# Patient Record
Sex: Female | Born: 2000 | Race: Black or African American | Hispanic: No | Marital: Single | Smoking: Never smoker
Health system: Southern US, Community
[De-identification: ages and names within clinical notes are randomized; demographics above are authoritative.]

## PROBLEM LIST (undated history)

## (undated) DIAGNOSIS — I471 Supraventricular tachycardia, unspecified: Secondary | ICD-10-CM

## (undated) DIAGNOSIS — R768 Other specified abnormal immunological findings in serum: Secondary | ICD-10-CM

## (undated) DIAGNOSIS — E559 Vitamin D deficiency, unspecified: Secondary | ICD-10-CM

## (undated) HISTORY — DX: Other specified abnormal immunological findings in serum: R76.8

## (undated) HISTORY — DX: Supraventricular tachycardia: I47.1

## (undated) HISTORY — PX: NO PAST SURGERIES: SHX2092

## (undated) HISTORY — DX: Supraventricular tachycardia, unspecified: I47.10

## (undated) HISTORY — DX: Vitamin D deficiency, unspecified: E55.9

---
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
## 2019-08-13 ENCOUNTER — Ambulatory Visit (HOSPITAL_COMMUNITY)
Admission: EM | Admit: 2019-08-13 | Discharge: 2019-08-13 | Disposition: A | Discharge status: Home / Self Care | Payer: BLUE CROSS/BLUE SHIELD | Attending: Family Medicine | Admitting: Family Medicine

## 2019-08-13 ENCOUNTER — Other Ambulatory Visit: Payer: Self-pay

## 2019-08-13 ENCOUNTER — Encounter (HOSPITAL_COMMUNITY): Payer: Self-pay | Admitting: *Deleted

## 2019-08-13 DIAGNOSIS — N390 Urinary tract infection, site not specified: Secondary | ICD-10-CM

## 2019-08-13 DIAGNOSIS — Z3202 Encounter for pregnancy test, result negative: Secondary | ICD-10-CM

## 2019-08-13 LAB — POCT URINALYSIS DIP (DEVICE)
Bilirubin Urine: NEGATIVE
Glucose, UA: NEGATIVE mg/dL
Ketones, ur: NEGATIVE mg/dL
Nitrite: NEGATIVE
Protein, ur: >=300 mg/dL — AB
Specific Gravity, Urine: >=1.03 (ref 1.005–1.030)
Urobilinogen, UA: 0.2 mg/dL (ref 0.0–1.0)
pH: 6.5 (ref 5.0–8.0)

## 2019-08-13 LAB — POCT PREGNANCY, URINE: Preg Test, Ur: NEGATIVE

## 2019-08-13 MED ORDER — NITROFURANTOIN MONOHYD MACRO 100 MG PO CAPS: 100.0000 mg | ORAL_CAPSULE | Freq: Two times a day (BID) | ORAL | 0 refills | Status: AC

## 2019-08-13 NOTE — ED Provider Notes (Signed)
[facility]    CSN: [id number] Arrival date & time: 08/13/19  1113      History   Chief Complaint Chief Complaint  Patient presents with  . Dysuria    HPI Nancy Page is a 18 y.o. female.   Nancy Page presents with complaints of pain and frequency of urination which started yesterday. No pelvic pain, no back pain. No fevers. No nausea or vomiting. Has had UTI in the past and this feels similar. No vaginal symptoms. LMP in July, she is on oral contraceptives with period every 3 months at baseline for her, however. Without contributing medical history.      ROS per HPI, negative if not otherwise mentioned.      History reviewed. No pertinent past medical history.  There are no active problems to display for this patient.   History reviewed. No pertinent surgical history.  OB History   No obstetric history on file.      Home Medications    Prior to Admission medications   Medication Sig Start Date End Date Taking? Authorizing Provider  levonorgestrel-ethinyl estradiol (SEASONALE,INTROVALE,JOLESSA) 0.15-0.03 MG tablet Take 1 tablet by mouth daily.   Yes [provider]  azithromycin (ZITHROMAX) 250 MG tablet Take 1 tablet (250 mg total) by mouth daily. Take first 2 tablets together, then 1 every day until finished. 10/03/17   Tasia Catchings, Amy V, PA-C  fluticasone (FLONASE) 50 MCG/ACT nasal spray Place 2 sprays into both nostrils daily. 10/03/17   Tasia Catchings, Amy V, PA-C  nitrofurantoin, macrocrystal-monohydrate, (MACROBID) 100 MG capsule Take 1 capsule (100 mg total) by mouth 2 (two) times daily for 5 days. 08/13/19 08/18/19  Zigmund Gottron, NP    Family History Family History  Problem Relation Age of Onset  . Healthy Mother   . Healthy Father     Social History Social History   Tobacco Use  . Smoking status: Never Smoker  . Smokeless tobacco: Never Used  Substance Use Topics  . Alcohol use: Yes    Comment: occasionally  . Drug use: Never     Allergies   Cephalexin, Milk-related compounds, and Wheat bran   Review of Systems Review of Systems   Physical Exam Triage Vital Signs ED Triage Vitals  Enc Vitals Group     BP 08/13/19 1128 112/70     Pulse Rate 08/13/19 1127 77     Resp 08/13/19 1127 16     Temp 08/13/19 1127 97.8 F (36.6 C)     Temp Source 08/13/19 1127 Other     SpO2 08/13/19 1127 100 %     Weight --      Height --      Head Circumference --      Peak Flow --      Pain Score 08/13/19 1128 0     Pain Loc --      Pain Edu? --      Excl. in [place]? --    No data found.  Updated Vital Signs BP 112/70   Pulse 77   Temp 97.8 F (36.6 C) (Other (Comment))   Resp 16   SpO2 100%    Physical Exam Constitutional:      General: She is not in acute distress.    Appearance: She is well-developed.  Cardiovascular:     Rate and Rhythm: Normal rate and regular rhythm.  Pulmonary:     Effort: Pulmonary effort is normal.  Abdominal:     General: There is  no distension.     Tenderness: There is no abdominal tenderness. There is no right CVA tenderness or left CVA tenderness.  Skin:    General: Skin is warm and dry.  Neurological:     Mental Status: She is alert and oriented to person, place, and time.      UC Treatments / Results  Labs (all labs ordered are listed, but only abnormal results are displayed) Labs Reviewed  POCT URINALYSIS DIP (DEVICE) - Abnormal; Notable for the following components:      Result Value   Hgb urine dipstick LARGE (*)    Protein, ur >=300 (*)    Leukocytes,Ua SMALL (*)    All other components within normal limits  URINE CULTURE  POC URINE PREG, ED  POCT PREGNANCY, URINE    EKG   Radiology No results found.  Procedures Procedures (including critical care time)  Medications Ordered in UC Medications - No data to display  Initial Impression / Assessment and Plan / UC Course  I have reviewed the triage vital signs and the nursing notes.  Pertinent labs &  imaging results that were available during my care of the patient were reviewed by me and considered in my medical decision making (see chart for details).     hgb and leuks to urine with symptoms consistent with uti. macrobid initiated. Culture pending. Return precautions provided. Patient verbalized understanding and agreeable to plan.    Final Clinical Impressions(s) / UC Diagnoses   Final diagnoses:  Lower urinary tract infectious disease     Discharge Instructions     Your urine and symptoms are consistent with UTI. Complete course of antibiotics.  Drink plenty of water to empty bladder regularly. Avoid alcohol and caffeine as these may irritate the bladder.  If symptoms worsen or do not improve in the next week to return to be seen or to follow up with your PCP.       ED Prescriptions    Medication Sig Dispense Auth. Provider   nitrofurantoin, macrocrystal-monohydrate, (MACROBID) 100 MG capsule Take 1 capsule (100 mg total) by mouth 2 (two) times daily for 5 days. 10 capsule Georgetta HaberBurky, Natalie B, NP     Controlled Substance Prescriptions [place] Controlled Substance Registry consulted? Not Applicable   Georgetta HaberBurky, Natalie B, NP 08/13/19 1209

## 2019-08-13 NOTE — Discharge Instructions (Signed)
Your urine and symptoms are consistent with UTI. Complete course of antibiotics.  Drink plenty of water to empty bladder regularly. Avoid alcohol and caffeine as these may irritate the bladder.  If symptoms worsen or do not improve in the next week to return to be seen or to follow up with your PCP.

## 2019-08-13 NOTE — ED Triage Notes (Signed)
C/O dysuria, polyuria, urinary urgency since yesterday.  Denies abd pain, fevers.  Denies vaginal discharge.

## 2019-08-15 LAB — URINE CULTURE: Culture: >=100000 — AB

## 2019-08-17 ENCOUNTER — Telehealth (HOSPITAL_COMMUNITY): Payer: Self-pay | Admitting: Emergency Medicine

## 2019-08-17 NOTE — Telephone Encounter (Signed)
Urine culture was positive for e coli and was given  macrobid at urgent care visit. Attempted to reach patient. No answer at this time.   

## 2020-03-18 DIAGNOSIS — R05 Cough: Secondary | ICD-10-CM | POA: Diagnosis not present

## 2020-03-18 DIAGNOSIS — Z20822 Contact with and (suspected) exposure to covid-19: Secondary | ICD-10-CM | POA: Diagnosis not present

## 2020-03-18 DIAGNOSIS — R110 Nausea: Secondary | ICD-10-CM | POA: Diagnosis not present

## 2020-03-18 DIAGNOSIS — R062 Wheezing: Secondary | ICD-10-CM | POA: Diagnosis not present

## 2020-04-15 DIAGNOSIS — B9689 Other specified bacterial agents as the cause of diseases classified elsewhere: Secondary | ICD-10-CM | POA: Diagnosis not present

## 2020-04-15 DIAGNOSIS — N760 Acute vaginitis: Secondary | ICD-10-CM | POA: Diagnosis not present

## 2020-07-05 DIAGNOSIS — Z23 Encounter for immunization: Secondary | ICD-10-CM | POA: Diagnosis not present

## 2020-07-05 DIAGNOSIS — S61217A Laceration without foreign body of left little finger without damage to nail, initial encounter: Secondary | ICD-10-CM | POA: Diagnosis not present

## 2020-07-05 DIAGNOSIS — W268XXA Contact with other sharp object(s), not elsewhere classified, initial encounter: Secondary | ICD-10-CM | POA: Diagnosis not present

## 2020-10-03 DIAGNOSIS — N760 Acute vaginitis: Secondary | ICD-10-CM | POA: Diagnosis not present

## 2020-10-03 DIAGNOSIS — B9689 Other specified bacterial agents as the cause of diseases classified elsewhere: Secondary | ICD-10-CM | POA: Diagnosis not present

## 2021-03-18 ENCOUNTER — Ambulatory Visit (HOSPITAL_COMMUNITY)
Admission: EM | Admit: 2021-03-18 | Discharge: 2021-03-18 | Disposition: A | Discharge status: Home / Self Care | Payer: BC Managed Care – PPO | Attending: Student | Admitting: Student

## 2021-03-18 ENCOUNTER — Other Ambulatory Visit: Payer: Self-pay

## 2021-03-18 ENCOUNTER — Encounter (HOSPITAL_COMMUNITY): Payer: Self-pay | Admitting: Emergency Medicine

## 2021-03-18 DIAGNOSIS — Z1152 Encounter for screening for COVID-19: Secondary | ICD-10-CM | POA: Insufficient documentation

## 2021-03-18 DIAGNOSIS — J029 Acute pharyngitis, unspecified: Secondary | ICD-10-CM | POA: Insufficient documentation

## 2021-03-18 DIAGNOSIS — Z112 Encounter for screening for other bacterial diseases: Secondary | ICD-10-CM | POA: Diagnosis not present

## 2021-03-18 DIAGNOSIS — R059 Cough, unspecified: Secondary | ICD-10-CM | POA: Diagnosis not present

## 2021-03-18 DIAGNOSIS — J069 Acute upper respiratory infection, unspecified: Secondary | ICD-10-CM | POA: Insufficient documentation

## 2021-03-18 LAB — POCT RAPID STREP A, ED / UC
Streptococcus, Group A Screen (Direct): NEGATIVE
Streptococcus, Group A Screen (Direct): NEGATIVE

## 2021-03-18 LAB — SARS CORONAVIRUS 2 (TAT 6-24 HRS): SARS Coronavirus 2: NEGATIVE

## 2021-03-18 MED ORDER — PREDNISONE 20 MG PO TABS: 20.0000 mg | ORAL_TABLET | Freq: Every day | ORAL | 0 refills | Status: AC

## 2021-03-18 MED ORDER — LIDOCAINE VISCOUS HCL 2 % MT SOLN: 15.0000 mL | OROMUCOSAL | 0 refills | Status: DC | PRN

## 2021-03-18 NOTE — ED Triage Notes (Signed)
Onset yesterday morning of sore throat, cough, and runny nose, headache and body aches.

## 2021-03-18 NOTE — Discharge Instructions (Addendum)
-  Prednisone, 1 pill taken in the morning for 5 days in a row.  This can give you energy, so take earlier in the day. -For sore throat, use lidocaine mouthwash up to every 4 hours. Make sure not to eat for at least 1 hour after using this, as your mouth will be very numb and you could bite yourself.  -Take Tylenol 1000 mg 3 times daily, and ibuprofen 800 mg 3 times daily with food.  You can take these together, or alternate every 3-4 hours. -Come back and see [place] if your symptoms worsen or persist, like worsening of sore throat, trouble swallowing, shortness of breath, etc.

## 2021-03-18 NOTE — ED Provider Notes (Signed)
[facility]    CSN: [id number] Arrival date & time: 03/18/21  2025      History   Chief Complaint Chief Complaint  Patient presents with  . Cough    HPI Nancy Page is a 20 y.o. female presenting with viral URI symptoms x1 day. Medical history noncontributory. Notes sore throat, nasal congestion, occ dry cough, body aches, mild headaches. Denies fevers/chills, n/v/d, shortness of breath, chest pain, facial pain, teeth pain, loss of taste/smell, swollen lymph nodes, ear pain.    HPI  History reviewed. No pertinent past medical history.  There are no problems to display for this patient.   History reviewed. No pertinent surgical history.  OB History   No obstetric history on file.      Home Medications    Prior to Admission medications   Medication Sig Start Date End Date Taking? Authorizing Provider  lidocaine (XYLOCAINE) 2 % solution Use as directed 15 mLs in the mouth or throat as needed for mouth pain. 03/18/21  Yes Rhys Martini, PA-C  predniSONE (DELTASONE) 20 MG tablet Take 1 tablet (20 mg total) by mouth daily for 5 days. 03/18/21 03/23/21 Yes Rhys Martini, PA-C  fluticasone (FLONASE) 50 MCG/ACT nasal spray Place 2 sprays into both nostrils daily. 10/03/17 03/18/21  Belinda Fisher, PA-C  levonorgestrel-ethinyl estradiol (SEASONALE,INTROVALE,JOLESSA) 0.15-0.03 MG tablet Take 1 tablet by mouth daily.  03/18/21  [provider]    Family History Family History  Problem Relation Age of Onset  . Healthy Mother   . Healthy Father     Social History Social History   Tobacco Use  . Smoking status: Never Smoker  . Smokeless tobacco: Never Used  Vaping Use  . Vaping Use: Never used  Substance Use Topics  . Alcohol use: Yes    Comment: occasionally  . Drug use: Never     Allergies   Cephalexin, Milk-related compounds, and Wheat bran   Review of Systems Review of Systems  Constitutional: Negative for appetite change, chills and  fever.  HENT: Positive for congestion and sore throat. Negative for ear pain, rhinorrhea, sinus pressure and sinus pain.   Eyes: Negative for redness and visual disturbance.  Respiratory: Positive for cough. Negative for chest tightness, shortness of breath and wheezing.   Cardiovascular: Negative for chest pain and palpitations.  Gastrointestinal: Negative for abdominal pain, constipation, diarrhea, nausea and vomiting.  Genitourinary: Negative for dysuria, frequency and urgency.  Musculoskeletal: Positive for myalgias.  Neurological: Positive for headaches. Negative for dizziness and weakness.  Psychiatric/Behavioral: Negative for confusion.  All other systems reviewed and are negative.    Physical Exam Triage Vital Signs ED Triage Vitals  Enc Vitals Group     BP      Pulse      Resp      Temp      Temp src      SpO2      Weight      Height      Head Circumference      Peak Flow      Pain Score      Pain Loc      Pain Edu?      Excl. in [place]?    No data found.  Updated Vital Signs BP 119/80 (BP Location: Left Arm) Comment: repositioned  Pulse 88   Temp 99.1 F (37.3 C) (Oral)   Resp 16   LMP 03/13/2021   SpO2 100%   Visual Acuity Right  Eye Distance:   Left Eye Distance:   Bilateral Distance:    Right Eye Near:   Left Eye Near:    Bilateral Near:     Physical Exam Vitals reviewed.  Constitutional:      General: She is not in acute distress.    Appearance: Normal appearance. She is not ill-appearing.  HENT:     Head: Normocephalic and atraumatic.     Right Ear: Hearing, tympanic membrane, ear canal and external ear normal. No swelling or tenderness. There is no impacted cerumen. No mastoid tenderness. Tympanic membrane is not perforated, erythematous, retracted or bulging.     Left Ear: Hearing, tympanic membrane, ear canal and external ear normal. No swelling or tenderness. There is no impacted cerumen. No mastoid tenderness. Tympanic membrane is not  perforated, erythematous, retracted or bulging.     Nose:     Right Sinus: No maxillary sinus tenderness or frontal sinus tenderness.     Left Sinus: No maxillary sinus tenderness or frontal sinus tenderness.     Mouth/Throat:     Mouth: Mucous membranes are moist.     Pharynx: Uvula midline. No oropharyngeal exudate or posterior oropharyngeal erythema.     Tonsils: No tonsillar exudate. 1+ on the right. 1+ on the left.     Comments: Smooth erythema posterior pharynx Cardiovascular:     Rate and Rhythm: Normal rate and regular rhythm.     Heart sounds: Normal heart sounds.  Pulmonary:     Breath sounds: Normal breath sounds and air entry. No wheezing, rhonchi or rales.  Chest:     Chest wall: No tenderness.  Abdominal:     General: Abdomen is flat. Bowel sounds are normal.     Tenderness: There is no abdominal tenderness. There is no guarding or rebound.  Lymphadenopathy:     Cervical: No cervical adenopathy.  Skin:    Capillary Refill: Capillary refill takes less than 2 seconds.  Neurological:     General: No focal deficit present.     Mental Status: She is alert and oriented to person, place, and time.  Psychiatric:        Attention and Perception: Attention and perception normal.        Mood and Affect: Mood and affect normal.        Behavior: Behavior normal. Behavior is cooperative.        Thought Content: Thought content normal.        Judgment: Judgment normal.      UC Treatments / Results  Labs (all labs ordered are listed, but only abnormal results are displayed) Labs Reviewed  CULTURE, GROUP A STREP ([facility])  SARS CORONAVIRUS 2 (TAT 6-24 HRS)  POCT RAPID STREP A, ED / UC  POCT RAPID STREP A, ED / UC    EKG   Radiology No results found.  Procedures Procedures (including critical care time)  Medications Ordered in UC Medications - No data to display  Initial Impression / Assessment and Plan / UC Course  I have reviewed the triage vital signs and the  nursing notes.  Pertinent labs & imaging results that were available during my care of the patient were reviewed by me and considered in my medical decision making (see chart for details).     This patient is a 20 year old female presenting with viral URI. Today this pt is afebrile nontachycardic nontachypneic, oxygenating well on room air, no wheezes rhonchi or rales.   Rapid strep negative. Culture sent. Covid PCR sent.  Isolation as per CDC guidelines. Prednisone and viscous lidocaine as below. She is not a diabetic. ED return precautions discussed  Final Clinical Impressions(s) / UC Diagnoses   Final diagnoses:  Viral URI with cough  Screening for streptococcal infection  Encounter for screening for COVID-19  Acute pharyngitis, unspecified etiology     Discharge Instructions     -Prednisone, 1 pill taken in the morning for 5 days in a row.  This can give you energy, so take earlier in the day. -For sore throat, use lidocaine mouthwash up to every 4 hours. Make sure not to eat for at least 1 hour after using this, as your mouth will be very numb and you could bite yourself.  -Take Tylenol 1000 mg 3 times daily, and ibuprofen 800 mg 3 times daily with food.  You can take these together, or alternate every 3-4 hours. -Come back and see [place] if your symptoms worsen or persist, like worsening of sore throat, trouble swallowing, shortness of breath, etc.    ED Prescriptions    Medication Sig Dispense Auth. Provider   predniSONE (DELTASONE) 20 MG tablet Take 1 tablet (20 mg total) by mouth daily for 5 days. 5 tablet Ignacia Bayley E, PA-C   lidocaine (XYLOCAINE) 2 % solution Use as directed 15 mLs in the mouth or throat as needed for mouth pain. 100 mL Rhys Martini, PA-C     PDMP not reviewed this encounter.   Rhys Martini, PA-C 03/18/21 [phone]

## 2021-03-20 LAB — CULTURE, GROUP A STREP (THRC)

## 2021-04-09 DIAGNOSIS — J209 Acute bronchitis, unspecified: Secondary | ICD-10-CM | POA: Diagnosis not present

## 2021-06-24 ENCOUNTER — Ambulatory Visit
Admission: EM | Admit: 2021-06-24 | Discharge: 2021-06-24 | Disposition: A | Discharge status: Home / Self Care | Payer: BC Managed Care – PPO | Attending: Emergency Medicine | Admitting: Emergency Medicine

## 2021-06-24 DIAGNOSIS — N760 Acute vaginitis: Secondary | ICD-10-CM | POA: Diagnosis not present

## 2021-06-24 LAB — POCT URINALYSIS DIP (MANUAL ENTRY)
Bilirubin, UA: NEGATIVE
Glucose, UA: NEGATIVE mg/dL
Nitrite, UA: NEGATIVE
Protein Ur, POC: NEGATIVE mg/dL
Spec Grav, UA: 1.015 (ref 1.010–1.025)
Urobilinogen, UA: 1 E.U./dL
pH, UA: 6.5 (ref 5.0–8.0)

## 2021-06-24 LAB — POCT URINE PREGNANCY: Preg Test, Ur: NEGATIVE

## 2021-06-24 MED ORDER — METRONIDAZOLE 500 MG PO TABS: 500.0000 mg | ORAL_TABLET | Freq: Two times a day (BID) | ORAL | 0 refills | Status: DC

## 2021-06-24 NOTE — Discharge Instructions (Addendum)
Urine culture to further rule out UTI, continue to drink plenty of fluids Vaginal swab pending-we will call if abnormal Begin metronidazole/Flagyl twice daily x1 week, no alcohol while taking May use over-the-counter hydrocortisone cream topically to area to help with the irritation over the next few days Follow-up if not improving or worsening

## 2021-06-24 NOTE — ED Triage Notes (Signed)
Pt presents with concern for vaginal itching and irritation after using a new soap. Denies discharge or vaginal odor. Denies concern for STD's.

## 2021-06-25 DIAGNOSIS — N760 Acute vaginitis: Secondary | ICD-10-CM | POA: Diagnosis not present

## 2021-06-25 NOTE — ED Provider Notes (Signed)
[facility]    CSN: [id number] Arrival date & time: 06/24/21  1425      History   Chief Complaint Chief Complaint  Patient presents with   Vaginal Itching    HPI Nancy Page is a 20 y.o. female presenting today for evaluation of vaginal irritation.  Patient reports over the past couple days she has developed vaginal irritation, denies associated discharge or itching.  Denies dysuria, but does report urinary frequency.  Reports symptoms began after changing soaps.  In the past with using different soaps she has had bacterial vaginosis.  She denies specific concerns for STDs.  Recently ended menstrual cycle last week.  HPI  History reviewed. No pertinent past medical history.  There are no problems to display for this patient.   History reviewed. No pertinent surgical history.  OB History   No obstetric history on file.      Home Medications    Prior to Admission medications   Medication Sig Start Date End Date Taking? Authorizing Provider  lidocaine (XYLOCAINE) 2 % solution Use as directed 15 mLs in the mouth or throat as needed for mouth pain. 03/18/21   Rhys Martini, PA-C  metroNIDAZOLE (FLAGYL) 500 MG tablet Take 1 tablet (500 mg total) by mouth 2 (two) times daily for 7 days. 06/24/21 07/01/21  Sadiyah Kangas C, PA-C  fluticasone (FLONASE) 50 MCG/ACT nasal spray Place 2 sprays into both nostrils daily. 10/03/17 03/18/21  Belinda Fisher, PA-C  levonorgestrel-ethinyl estradiol (SEASONALE,INTROVALE,JOLESSA) 0.15-0.03 MG tablet Take 1 tablet by mouth daily.  03/18/21  [provider]    Family History Family History  Problem Relation Age of Onset   Healthy Mother    Healthy Father     Social History Social History   Tobacco Use   Smoking status: Never   Smokeless tobacco: Never  Vaping Use   Vaping Use: Never used  Substance Use Topics   Alcohol use: Yes    Comment: occasionally   Drug use: Never     Allergies   Cephalexin,  Milk-related compounds, and Wheat bran   Review of Systems Review of Systems  Constitutional:  Negative for fever.  Respiratory:  Negative for shortness of breath.   Cardiovascular:  Negative for chest pain.  Gastrointestinal:  Negative for abdominal pain, diarrhea, nausea and vomiting.  Genitourinary:  Negative for dysuria, flank pain, genital sores, hematuria, menstrual problem, vaginal bleeding, vaginal discharge and vaginal pain.  Musculoskeletal:  Negative for back pain.  Skin:  Negative for rash.  Neurological:  Negative for dizziness, light-headedness and headaches.    Physical Exam Triage Vital Signs ED Triage Vitals  Enc Vitals Group     BP 06/24/21 1530 120/73     Pulse Rate 06/24/21 1530 99     Resp 06/24/21 1530 19     Temp 06/24/21 1530 99.2 F (37.3 C)     Temp src --      SpO2 06/24/21 1530 97 %     Weight --      Height --      Head Circumference --      Peak Flow --      Pain Score 06/24/21 1529 0     Pain Loc --      Pain Edu? --      Excl. in GC? --    No data found.  Updated Vital Signs BP 120/73   Pulse 99   Temp 99.2 F (37.3 C)   Resp 19  LMP 06/16/2021   SpO2 97%   Visual Acuity Right Eye Distance:   Left Eye Distance:   Bilateral Distance:    Right Eye Near:   Left Eye Near:    Bilateral Near:     Physical Exam Vitals and nursing note reviewed.  Constitutional:      Appearance: She is well-developed.     Comments: No acute distress  HENT:     Head: Normocephalic and atraumatic.     Nose: Nose normal.  Eyes:     Conjunctiva/sclera: Conjunctivae normal.  Cardiovascular:     Rate and Rhythm: Normal rate.  Pulmonary:     Effort: Pulmonary effort is normal. No respiratory distress.  Abdominal:     General: There is no distension.  Musculoskeletal:        General: Normal range of motion.     Cervical back: Neck supple.  Skin:    General: Skin is warm and dry.  Neurological:     Mental Status: She is alert and oriented to  person, place, and time.     UC Treatments / Results  Labs (all labs ordered are listed, but only abnormal results are displayed) Labs Reviewed  POCT URINALYSIS DIP (MANUAL ENTRY) - Abnormal; Notable for the following components:      Result Value   Ketones, POC UA trace (5) (*)    Blood, UA trace-intact (*)    Leukocytes, UA Small (1+) (*)    All other components within normal limits  URINE CULTURE  POCT URINE PREGNANCY  CERVICOVAGINAL ANCILLARY ONLY    EKG   Radiology No results found.  Procedures Procedures (including critical care time)  Medications Ordered in UC Medications - No data to display  Initial Impression / Assessment and Plan / UC Course  I have reviewed the triage vital signs and the nursing notes.  Pertinent labs & imaging results that were available during my care of the patient were reviewed by me and considered in my medical decision making (see chart for details).     Small leuks on UA, will send for culture to further evaluate for UTI, empirically treating for BV today with Flagyl given history of similar with changing soaps.  We will call with results of culture/swab if abnormal and alter therapy as needed.  Discussed strict return precautions. Patient verbalized understanding and is agreeable with plan.  Final Clinical Impressions(s) / UC Diagnoses   Final diagnoses:  Vaginitis and vulvovaginitis     Discharge Instructions      Urine culture to further rule out UTI, continue to drink plenty of fluids Vaginal swab pending-we will call if abnormal Begin metronidazole/Flagyl twice daily x1 week, no alcohol while taking May use over-the-counter hydrocortisone cream topically to area to help with the irritation over the next few days Follow-up if not improving or worsening     ED Prescriptions     Medication Sig Dispense Auth. Provider   metroNIDAZOLE (FLAGYL) 500 MG tablet  (Status: Discontinued) Take 1 tablet (500 mg total) by mouth  2 (two) times daily for 7 days. 14 tablet Daveyon Kitchings C, PA-C   metroNIDAZOLE (FLAGYL) 500 MG tablet Take 1 tablet (500 mg total) by mouth 2 (two) times daily for 7 days. 14 tablet Billey Wojciak, Friend[place] C, PA-C      PDMP not reviewed this encounter.   Lew Dawes, [place] 06/25/21 [phone]

## 2021-06-26 ENCOUNTER — Telehealth: Payer: Self-pay | Admitting: Emergency Medicine

## 2021-06-26 LAB — CERVICOVAGINAL ANCILLARY ONLY
Bacterial Vaginitis (gardnerella): NEGATIVE
Candida Glabrata: NEGATIVE
Candida Vaginitis: POSITIVE — AB
Chlamydia: NEGATIVE
Comment: NEGATIVE
Comment: NEGATIVE
Comment: NEGATIVE
Comment: NEGATIVE
Comment: NEGATIVE
Comment: NORMAL
Neisseria Gonorrhea: NEGATIVE
Trichomonas: NEGATIVE

## 2021-06-26 LAB — URINE CULTURE: Culture: NO GROWTH

## 2021-06-26 MED ORDER — METRONIDAZOLE 500 MG PO TABS: 500.0000 mg | ORAL_TABLET | Freq: Two times a day (BID) | ORAL | 0 refills | Status: AC

## 2021-06-26 MED ORDER — FLUCONAZOLE 150 MG PO TABS: 150.0000 mg | ORAL_TABLET | Freq: Every day | ORAL | 0 refills | Status: DC

## 2021-06-26 MED ORDER — FLUCONAZOLE 150 MG PO TABS: 150.0000 mg | ORAL_TABLET | Freq: Every day | ORAL | 0 refills | Status: AC

## 2021-07-21 ENCOUNTER — Emergency Department (HOSPITAL_COMMUNITY)
Admission: EM | Admit: 2021-07-21 | Discharge: 2021-07-21 | Disposition: A | Discharge status: Home / Self Care | Payer: BC Managed Care – PPO | Attending: Emergency Medicine | Admitting: Emergency Medicine

## 2021-07-21 ENCOUNTER — Emergency Department (HOSPITAL_COMMUNITY): Payer: BC Managed Care – PPO

## 2021-07-21 DIAGNOSIS — R457 State of emotional shock and stress, unspecified: Secondary | ICD-10-CM | POA: Diagnosis not present

## 2021-07-21 DIAGNOSIS — R0602 Shortness of breath: Secondary | ICD-10-CM | POA: Diagnosis not present

## 2021-07-21 DIAGNOSIS — R002 Palpitations: Secondary | ICD-10-CM | POA: Insufficient documentation

## 2021-07-21 DIAGNOSIS — R000 Tachycardia, unspecified: Secondary | ICD-10-CM | POA: Diagnosis not present

## 2021-07-21 DIAGNOSIS — R42 Dizziness and giddiness: Secondary | ICD-10-CM | POA: Insufficient documentation

## 2021-07-21 DIAGNOSIS — R0789 Other chest pain: Secondary | ICD-10-CM | POA: Diagnosis not present

## 2021-07-21 LAB — CBC WITH DIFFERENTIAL/PLATELET
Abs Immature Granulocytes: 0.02 10*3/uL (ref 0.00–0.07)
Basophils Absolute: 0 10*3/uL (ref 0.0–0.1)
Basophils Relative: 1 %
Eosinophils Absolute: 0 10*3/uL (ref 0.0–0.5)
Eosinophils Relative: 0 %
HCT: 38.1 % (ref 36.0–46.0)
Hemoglobin: 12.8 g/dL (ref 12.0–15.0)
Immature Granulocytes: 0 %
Lymphocytes Relative: 14 %
Lymphs Abs: 0.9 10*3/uL (ref 0.7–4.0)
MCH: 30.5 pg (ref 26.0–34.0)
MCHC: 33.6 g/dL (ref 30.0–36.0)
MCV: 90.7 fL (ref 80.0–100.0)
Monocytes Absolute: 0.3 10*3/uL (ref 0.1–1.0)
Monocytes Relative: 5 %
Neutro Abs: 5 10*3/uL (ref 1.7–7.7)
Neutrophils Relative %: 80 %
Platelets: 297 10*3/uL (ref 150–400)
RBC: 4.2 MIL/uL (ref 3.87–5.11)
RDW: 13 % (ref 11.5–15.5)
WBC: 6.3 10*3/uL (ref 4.0–10.5)
nRBC: 0 % (ref 0.0–0.2)

## 2021-07-21 LAB — I-STAT BETA HCG BLOOD, ED (MC, WL, AP ONLY): I-stat hCG, quantitative: <5 m[IU]/mL (ref <5)

## 2021-07-21 LAB — BASIC METABOLIC PANEL
Anion gap: 11 (ref 5–15)
BUN: 8 mg/dL (ref 6–20)
CO2: 21 mmol/L — ABNORMAL LOW (ref 22–32)
Calcium: 9.3 mg/dL (ref 8.9–10.3)
Chloride: 105 mmol/L (ref 98–111)
Creatinine, Ser: 0.92 mg/dL (ref 0.44–1.00)
GFR, Estimated: >60 mL/min (ref >60)
Glucose, Bld: 121 mg/dL — ABNORMAL HIGH (ref 70–99)
Potassium: 3.7 mmol/L (ref 3.5–5.1)
Sodium: 137 mmol/L (ref 135–145)

## 2021-07-21 LAB — TROPONIN I (HIGH SENSITIVITY): Troponin I (High Sensitivity): 3 ng/L (ref <18)

## 2021-07-21 LAB — MAGNESIUM: Magnesium: 2.2 mg/dL (ref 1.7–2.4)

## 2021-07-21 LAB — TSH: TSH: 1.266 u[IU]/mL (ref 0.350–4.500)

## 2021-07-21 LAB — D-DIMER, QUANTITATIVE: D-Dimer, Quant: <0.27 ug/mL-FEU (ref 0.00–0.50)

## 2021-07-21 MED ORDER — ACETAMINOPHEN 325 MG PO TABS: 650.0000 mg | ORAL_TABLET | Freq: Once | ORAL | Status: DC

## 2021-07-21 NOTE — ED Notes (Signed)
To xray, alert, NAD, calm. Denies sx or complaints, questions or needs at this time.

## 2021-07-21 NOTE — ED Triage Notes (Signed)
Pt here via [facility] for eval of tachycardia-180's. Pt since converted to ST at 150's en route. Pt states she feels anxious, hx of same but well-controlled. NAD, no additional complaints.

## 2021-07-21 NOTE — ED Notes (Addendum)
EDP in to see pt. Pt seen by EDP prior to RN assessment, see MD notes, triage protocol results including labs, VS, EKG reviewed.

## 2021-07-21 NOTE — ED Provider Notes (Signed)
[facility] EMERGENCY DEPARTMENT Provider Note   CSN: [id number] Arrival date & time: 07/21/21  0105     History Chief Complaint  Patient presents with   Tachycardia    Nancy Page is a 20 y.o. female.  HPI  20 year old female with no admitted past medical history presents the emergency department for episode of palpitations, chest discomfort or shortness of breath.  Patient states this happened last night when she stepped in the shower.  She has had episodes of rapid heart rate/palpitations in the past but they have always self resolved quickly.  Patient states her symptoms have been going on for about 8 hours until she came to the emergency department.  Currently upon arriving to the patient room she states her symptoms have resolved.   Prior to this episode she states she is otherwise been in her usual state of health.  No recent fever, illness.  No history of or current risk factors for DVT/PE.  No past medical history on file.  There are no problems to display for this patient.   No past surgical history on file.   OB History   No obstetric history on file.     Family History  Problem Relation Age of Onset   Healthy Mother    Healthy Father     Social History   Tobacco Use   Smoking status: Never   Smokeless tobacco: Never  Vaping Use   Vaping Use: Never used  Substance Use Topics   Alcohol use: Yes    Comment: occasionally   Drug use: Never    Home Medications Prior to Admission medications   Medication Sig Start Date End Date Taking? Authorizing Provider  lidocaine (XYLOCAINE) 2 % solution Use as directed 15 mLs in the mouth or throat as needed for mouth pain. 03/18/21   Rhys Martini, PA-C  fluticasone (FLONASE) 50 MCG/ACT nasal spray Place 2 sprays into both nostrils daily. 10/03/17 03/18/21  Belinda Fisher, PA-C  levonorgestrel-ethinyl estradiol (SEASONALE,INTROVALE,JOLESSA) 0.15-0.03 MG tablet Take 1 tablet by mouth daily.   03/18/21  [provider]    Allergies    Cephalexin, Milk-related compounds, and Wheat bran  Review of Systems   Review of Systems  Constitutional:  Positive for fatigue. Negative for chills and fever.  HENT:  Negative for congestion.   Eyes:  Negative for visual disturbance.  Respiratory:  Positive for chest tightness and shortness of breath.   Cardiovascular:  Positive for palpitations. Negative for chest pain and leg swelling.  Gastrointestinal:  Negative for abdominal pain, diarrhea and vomiting.  Genitourinary:  Negative for dysuria.  Skin:  Negative for rash.  Neurological:  Positive for light-headedness. Negative for syncope and headaches.   Physical Exam Updated Vital Signs BP (!) 123/93   Pulse 72   Temp 98.7 F (37.1 C) (Oral)   Resp 16   SpO2 100%   Physical Exam Vitals and nursing note reviewed.  Constitutional:      Appearance: Normal appearance.  HENT:     Head: Normocephalic.     Mouth/Throat:     Mouth: Mucous membranes are moist.  Cardiovascular:     Rate and Rhythm: Normal rate.  Pulmonary:     Effort: Pulmonary effort is normal. No respiratory distress.  Abdominal:     Palpations: Abdomen is soft.     Tenderness: There is no abdominal tenderness.  Musculoskeletal:        General: No swelling.  Skin:  General: Skin is warm.  Neurological:     Mental Status: She is alert and oriented to person, place, and time. Mental status is at baseline.  Psychiatric:        Mood and Affect: Mood normal.    ED Results / Procedures / Treatments   Labs (all labs ordered are listed, but only abnormal results are displayed) Labs Reviewed  BASIC METABOLIC PANEL - Abnormal; Notable for the following components:      Result Value   CO2 21 (*)    Glucose, Bld 121 (*)    All other components within normal limits  CBC WITH DIFFERENTIAL/PLATELET  D-DIMER, QUANTITATIVE  MAGNESIUM  TSH  I-STAT BETA HCG BLOOD, ED ([facility], [facility], [facility] ONLY)  TROPONIN I (HIGH  SENSITIVITY)    EKG EKG Interpretation  Date/Time:  Monday July 21 2021 01:06:17 EDT Ventricular Rate:  142 PR Interval:  128 QRS Duration: 82 QT Interval:  350 QTC Calculation: 538 R Axis:   1 Text Interpretation: Sinus tachycardia Nonspecific ST and T wave abnormality Abnormal ECG Sinus tachycardia, no previous Confirmed by Coralee Pesa [phone]) on 07/21/2021 11:27:05 AM  Radiology DG Chest 2 View  Result Date: 07/21/2021 CLINICAL DATA:  Shortness of breath and tachycardia. EXAM: CHEST - 2 VIEW COMPARISON:  03/11/2001, report only FINDINGS: Numerous leads and wires project over the chest. Midline trachea. Normal heart size and mediastinal contours. No pleural effusion or pneumothorax. Clear lungs. IMPRESSION: No acute cardiopulmonary disease. Electronically Signed   By: Jeronimo Greaves M.D.   On: 07/21/2021 13:12    Procedures Procedures   Medications Ordered in ED Medications  acetaminophen (TYLENOL) tablet 650 mg (650 mg Oral Not Given 07/21/21 1233)    ED Course  I have reviewed the triage vital signs and the nursing notes.  Pertinent labs & imaging results that were available during my care of the patient were reviewed by me and considered in my medical decision making (see chart for details).    MDM Rules/Calculators/A&P                           20 year old female presented to the emergency department after concern for palpitations, lightheadedness and shortness of breath.  She was tachycardic on arrival with heart rates in the 140s, nonspecific ST changes on EKG.  By the time I saw the patient in the patient room her heart rate had normalized to a rate in the 60s with a normal blood pressure.  She was asymptomatic at the time that I saw her.  Blood work is reassuring, troponin is negative, D-dimer is normal, TSH is normal.  Chest x-ray shows no acute finding.  She is remained in normal rate/rhythm for the entirety of my evaluation.  Patient states that this is happened  before just not as severe.  I recommend that she follows up with cardiology for possible Holter monitoring evaluation and encouraged hydration.  She is otherwise well-appearing with no complaints.  Patient at this time appears safe and stable for discharge and will be treated as an outpatient.  Discharge plan and strict return to ED precautions discussed, patient verbalizes understanding and agreement.  Final Clinical Impression(s) / ED Diagnoses Final diagnoses:  None    Rx / DC Orders ED Discharge Orders     None        Rozelle Logan, DO 07/21/21 1506

## 2021-07-21 NOTE — Discharge Instructions (Addendum)
You have been seen and discharged from the emergency department.  Your heart rate normalized while you are in the department.  Your blood work and x-ray were normal.  I have placed an ambulatory cardiology referral, follow-up with cardiology and your primary doctor for further evaluation and possible Holter monitoring.  Stay well-hydrated.  Take home medications as prescribed. If you have any worsening symptoms or further concerns for your health please return to an emergency department for further evaluation.

## 2021-07-21 NOTE — ED Provider Notes (Signed)
Emergency Medicine Provider Triage Evaluation Note  Nancy Page , a 20 y.o. female  was evaluated in triage.  Pt complains of dizziness.  The patient reports that she suddenly became dizzy and short of breath shortly after getting out of the shower earlier tonight.  She states that she felt as if she was going to pass out.  She is very anxious and reports that during her shower she drinks some of the water that came out of the showerhead and the showerhead contains an LED light.  She is unsure if this could be harmful for her.  She has a history of anxiety, but no history of panic attacks.  She still feels that her breathing is heavy, but denies chest pain, syncope, vomiting, fever, or leg swelling.  Review of Systems  Positive: Dizziness, lightheadedness, shortness of breath, palpitations Negative: Chest pain, leg swelling, syncope, vomiting, fever, rash  Physical Exam  BP (!) 156/94 (BP Location: Left Arm)   Pulse (!) 138   Temp 99.5 F (37.5 C) (Oral)   Resp 20   SpO2 100%  Gen:   Awake, no distress, anxious appearing Resp:  Normal effort  MSK:   Moves extremities without difficulty  Other:  Tachycardic  Medical Decision Making  Medically screening exam initiated at 1:26 AM.  Appropriate orders placed.  Nancy Page was informed that the remainder of the evaluation will be completed by another provider, this initial triage assessment does not replace that evaluation, and the importance of remaining in the ED until their evaluation is complete.  EMS reports that patient had heart rate of 180s in route, but improved to 150s while being transported.  Heart rate is 130s and triage.  She is adamantly denying chest pain.  Will order EKG and labs.  She will require further work-up and evaluation in the ED.  Vital signs are otherwise stable at this time.   Nancy Boards, PA-C 07/21/21 0131    Nancy Conn, MD 07/21/21 [phone]

## 2021-08-19 ENCOUNTER — Encounter (HOSPITAL_COMMUNITY): Payer: Self-pay

## 2021-08-19 ENCOUNTER — Ambulatory Visit (HOSPITAL_COMMUNITY)
Admission: EM | Admit: 2021-08-19 | Discharge: 2021-08-19 | Disposition: A | Discharge status: Home / Self Care | Payer: BC Managed Care – PPO | Attending: Emergency Medicine | Admitting: Emergency Medicine

## 2021-08-19 ENCOUNTER — Other Ambulatory Visit: Payer: Self-pay

## 2021-08-19 DIAGNOSIS — R062 Wheezing: Secondary | ICD-10-CM | POA: Diagnosis not present

## 2021-08-19 DIAGNOSIS — R059 Cough, unspecified: Secondary | ICD-10-CM

## 2021-08-19 MED ORDER — BENZONATATE 100 MG PO CAPS: 100.0000 mg | ORAL_CAPSULE | Freq: Three times a day (TID) | ORAL | 0 refills | Status: AC

## 2021-08-19 MED ORDER — BENZONATATE 100 MG PO CAPS: 100.0000 mg | ORAL_CAPSULE | Freq: Three times a day (TID) | ORAL | 0 refills | Status: DC

## 2021-08-19 MED ORDER — PREDNISONE 20 MG PO TABS: 40.0000 mg | ORAL_TABLET | Freq: Every day | ORAL | 0 refills | Status: DC

## 2021-08-19 NOTE — Discharge Instructions (Addendum)
Starting tomorrow morning take prednisone with food and daily for 5 days  Continue Use of Tessalon pills 3 times a day for additional comfort  Continue use of albuterol inhaler 2 puffs as needed  Can return to urgent care for reevaluation if symptoms continue to persist or worsen

## 2021-08-19 NOTE — ED Triage Notes (Signed)
Pt reports wheezing x 1 week, worst at night. Pt reports last time she was seen she had albuterol inhaler and tessalon prescribed.

## 2021-08-21 NOTE — ED Provider Notes (Signed)
[facility]    CSN: [id number] Arrival date & time: 08/19/21  1359      History   Chief Complaint Chief Complaint  Patient presents with  . Wheezing    HPI Nancy Page is a 20 y.o. female.   Patient presents with nonproductive cough ,intermittent shortness of breath with exertion and  wheezing primarily at night for 1 week.  Endorses had viral illness 1-1/2 weeks ago and all symptoms had resolved.  Endorses that she typically has bronchitis after illnesses.  Has been using Tessalon pills which provide relief but ran out yesterday.  Endorses that she typically requires a steroid injection but these have been leaving bite marks and injection site.  Denies fever, chills, body aches, abdominal pain, nausea, vomiting, diarrhea, chest pain or tightness, sore throat, headaches, ear pain or fullness.  No pertinent medical history.  History reviewed. No pertinent past medical history.  There are no problems to display for this patient.   History reviewed. No pertinent surgical history.  OB History   No obstetric history on file.      Home Medications    Prior to Admission medications   Medication Sig Start Date End Date Taking? Authorizing Provider  predniSONE (DELTASONE) 20 MG tablet Take 2 tablets (40 mg total) by mouth daily. 08/19/21  Yes Darline Faith R, NP  Aspirin-Salicylamide-Caffeine (BC HEADACHE PO) Take 1 packet by mouth every 6 (six) hours as needed (headache).    [provider]  benzonatate (TESSALON) 100 MG capsule Take 1 capsule (100 mg total) by mouth every 8 (eight) hours. 08/19/21 09/18/21  Valinda Hoar, NP  lidocaine (XYLOCAINE) 2 % solution Use as directed 15 mLs in the mouth or throat as needed for mouth pain. Patient not taking: No sig reported 03/18/21   Rhys Martini, PA-C  fluticasone [facility]) 50 MCG/ACT nasal spray Place 2 sprays into both nostrils daily. 10/03/17 03/18/21  Belinda Fisher, PA-C  levonorgestrel-ethinyl estradiol  (SEASONALE,INTROVALE,JOLESSA) 0.15-0.03 MG tablet Take 1 tablet by mouth daily.  03/18/21  [provider]    Family History Family History  Problem Relation Age of Onset  . Healthy Mother   . Healthy Father     Social History Social History   Tobacco Use  . Smoking status: Never  . Smokeless tobacco: Never  Vaping Use  . Vaping Use: Never used  Substance Use Topics  . Alcohol use: Yes    Comment: occasionally  . Drug use: Never     Allergies   Cephalexin, Milk-related compounds, Wheat bran, and Other   Review of Systems Review of Systems  Constitutional: Negative.   HENT: Negative.    Respiratory:  Positive for cough, shortness of breath and wheezing. Negative for apnea, choking, chest tightness and stridor.   Cardiovascular: Negative.   Genitourinary: Negative.   Skin: Negative.   Neurological: Negative.     Physical Exam Triage Vital Signs ED Triage Vitals  Enc Vitals Group     BP 08/19/21 1439 111/72     Pulse Rate 08/19/21 1439 75     Resp 08/19/21 1439 16     Temp 08/19/21 1439 98.7 F (37.1 C)     Temp Source 08/19/21 1439 Oral     SpO2 08/19/21 1439 99 %     Weight --      Height --      Head Circumference --      Peak Flow --      Pain Score 08/19/21  1437 0     Pain Loc --      Pain Edu? --      Excl. in GC? --    No data found.  Updated Vital Signs BP 111/72 (BP Location: Right Arm)   Pulse 75   Temp 98.7 F (37.1 C) (Oral)   Resp 16   LMP  (Within Days)   SpO2 99%   Visual Acuity Right Eye Distance:   Left Eye Distance:   Bilateral Distance:    Right Eye Near:   Left Eye Near:    Bilateral Near:     Physical Exam Constitutional:      Appearance: Normal appearance. She is normal weight.  HENT:     Head: Normocephalic.     Right Ear: Tympanic membrane, ear canal and external ear normal.     Left Ear: Tympanic membrane, ear canal and external ear normal.     Nose: Nose normal.     Mouth/Throat:     Mouth: Mucous  membranes are moist.     Pharynx: Oropharynx is clear.  Eyes:     Extraocular Movements: Extraocular movements intact.  Cardiovascular:     Rate and Rhythm: Normal rate and regular rhythm.     Pulses: Normal pulses.     Heart sounds: Normal heart sounds.  Pulmonary:     Effort: Pulmonary effort is normal.     Breath sounds: Normal breath sounds.     Comments: Nonproductive cough witnessed Skin:    General: Skin is warm and dry.  Neurological:     Mental Status: She is alert and oriented to person, place, and time. Mental status is at baseline.  Psychiatric:        Mood and Affect: Mood normal.        Behavior: Behavior normal.     UC Treatments / Results  Labs (all labs ordered are listed, but only abnormal results are displayed) Labs Reviewed - No data to display  EKG   Radiology No results found.  Procedures Procedures (including critical care time)  Medications Ordered in UC Medications - No data to display  Initial Impression / Assessment and Plan / UC Course  I have reviewed the triage vital signs and the nursing notes.  Pertinent labs & imaging results that were available during my care of the patient were reviewed by me and considered in my medical decision making (see chart for details).  Coughing Wheezing  1.  Prednisone 40 mg daily with food 2.  Albuterol inhaler 90 mcg 2 puffs every 4 hours as needed 3.  Tessalon 100 mg 3 times daily. 4.  Return precautions given for follow-up in urgent care for persistent or reoccurring symptoms Final Clinical Impressions(s) / UC Diagnoses   Final diagnoses:  Cough  Wheezing     Discharge Instructions      Starting tomorrow morning take prednisone with food and daily for 5 days  Continue Use of Tessalon pills 3 times a day for additional comfort  Continue use of albuterol inhaler 2 puffs as needed  Can return to urgent care for reevaluation if symptoms continue to persist or worsen    ED Prescriptions      Medication Sig Dispense Auth. Provider   benzonatate (TESSALON) 100 MG capsule  (Status: Discontinued) Take 1 capsule (100 mg total) by mouth every 8 (eight) hours. 60 capsule Carnetta Losada R, NP   predniSONE (DELTASONE) 20 MG tablet Take 2 tablets (40 mg total) by mouth daily. 10  tablet Salli Quarry R, NP   benzonatate (TESSALON) 100 MG capsule Take 1 capsule (100 mg total) by mouth every 8 (eight) hours. 90 capsule Mahkai Fangman, Elita Boone, NP      PDMP not reviewed this encounter.   Valinda Hoar, NP 08/21/21 1126

## 2021-09-01 NOTE — Progress Notes (Signed)
Cardiology Office Note  Date:  09/02/2021   ID:  Nancy Page, DOB [birth year], MRN [id number]  PCP:  Pcp, No   Chief Complaint  Patient presents with   New Patient (Initial Visit)    Ref by Dr. Wilkie Aye [facility] ER physician) for tachycardia. Patient c/o chest pain with rapid heartbeats at times. Medications reviewed by the patient verbally.     HPI:  Nancy Page is a 20 year old woman with no prior cardiac history Recently seen in the emergency room July 21, 2021 for palpitations, chest discomfort Who presents by referral from the emergency room for consultation of her tachycardia  Denies having any prior episodes of tachycardia Reports that she was taking a shower July 21, 2021, developed acute onset tachycardia Very rapid heartbeat, with some shortness of breath, lightheadedness She called EMTs, she reports no medications were given She did a Valsalva in the field, (blew into a straw )heart rate dropped from over 180 down to 140s Waited in the waiting room in the emergency room for many hours Reports tachycardia lasting for 8 hours Without intervention she reports heart rate eventually dropped from 140 down to her regular heart rate 60  Discharged with heart rate of 60, not given any medications Recommended she come see [place] today  Has not had any further episodes since that time EKG in the emergency room reviewed atrial tachycardia rate 140 bpm  Feels back to her baseline but concerned it will happen again  EKG personally reviewed by myself on todays visit Normal sinus rhythm rate 87 bpm no significant ST-T wave changes  PMH:   has no past medical history on file.  PSH:   History reviewed. No pertinent surgical history.  Current Outpatient Medications  Medication Sig Dispense Refill   benzonatate (TESSALON) 100 MG capsule Take 1 capsule (100 mg total) by mouth every 8 (eight) hours. 90 capsule 0   propranolol (INDERAL) 20 MG tablet Take 1 tablet (20 mg total) by  mouth 3 (three) times daily. Can take 10-20 mg as needed 30 tablet 3   albuterol (VENTOLIN HFA) 108 (90 Base) MCG/ACT inhaler Inhale 2 puffs into the lungs every 6 (six) hours as needed.     Aspirin-Salicylamide-Caffeine (BC HEADACHE PO) Take 1 packet by mouth every 6 (six) hours as needed (headache). (Patient not taking: Reported on 09/02/2021)     lidocaine (XYLOCAINE) 2 % solution Use as directed 15 mLs in the mouth or throat as needed for mouth pain. (Patient not taking: No sig reported) 100 mL 0   predniSONE (DELTASONE) 20 MG tablet Take 2 tablets (40 mg total) by mouth daily. (Patient not taking: Reported on 09/02/2021) 10 tablet 0   No current facility-administered medications for this visit.     Allergies:   Cephalexin, Milk-related compounds, Wheat bran, and Other   Social History:  The patient  reports that she has never smoked. She has never used smokeless tobacco. She reports current alcohol use. She reports that she does not use drugs.   Family History:   family history includes Healthy in her father and mother; Heart murmur in her brother.    Review of Systems: Review of Systems  Constitutional: Negative.   HENT: Negative.    Respiratory: Negative.    Cardiovascular: Negative.   Gastrointestinal: Negative.   Musculoskeletal: Negative.   Neurological: Negative.   Psychiatric/Behavioral: Negative.    All other systems reviewed and are negative.   PHYSICAL EXAM: VS:  BP 110/60 (BP Location: Right Arm,  Patient Position: Sitting, Cuff Size: Normal)   Pulse 87   Ht 5' (1.524 m)   Wt 133 lb (60.3 kg)   SpO2 98%   BMI 25.97 kg/m  , BMI Body mass index is 25.97 kg/m. GEN: Well nourished, well developed, in no acute distress HEENT: normal Neck: no JVD, carotid bruits, or masses Cardiac: RRR; no murmurs, rubs, or gallops,no edema  Respiratory:  clear to auscultation bilaterally, normal work of breathing GI: soft, nontender, nondistended, + BS MS: no deformity or  atrophy Skin: warm and dry, no rash Neuro:  Strength and sensation are intact Psych: euthymic mood, full affect   Recent Labs: 07/21/2021: BUN 8; Creatinine, Ser 0.92; Hemoglobin 12.8; Magnesium 2.2; Platelets 297; Potassium 3.7; Sodium 137; TSH 1.266    Lipid Panel No results found for: CHOL, HDL, LDLCALC, TRIG    Wt Readings from Last 3 Encounters:  09/02/21 133 lb (60.3 kg)      ASSESSMENT AND PLAN:  Problem List Items Addressed This Visit   None Visit Diagnoses     SVT (supraventricular tachycardia) (HCC)    -  Primary   Relevant Medications   propranolol (INDERAL) 20 MG tablet   Other Relevant Orders   EKG 12-Lead   Atrial tachycardia (HCC)       Relevant Medications   propranolol (INDERAL) 20 MG tablet      By history, likely had SVT rate 180 and higher when EMTs came to her house Reports with Valsalva, rate down to 140 ,still not normal EKG concerning for atrial tachycardia Sat in the ER with rate 140 for 8 hours Eventually broke on its own down to 60 bpm Recommended she has recurrent episodes she try to record it with a cardio mobile device Okay to call EMTs, adenosine/metoprolol could be given IV Recommend she also take a propranolol 20 mg as needed Call us for further episodes For frequent episodes may need EP evaluation Otherwise normal EKG today normal clinical exam no prior history, will hold off on additional cardiac testing such as echocardiography and monitor given lone episode Instructions above detailed with her   Total encounter time more than 45 minutes  Greater than 50% was spent in counseling and coordination of care with the patient    Signed, Dossie Arbour, M.D., Ph.D. [facility] [place], [place] [phone]

## 2021-09-02 ENCOUNTER — Other Ambulatory Visit: Payer: Self-pay

## 2021-09-02 ENCOUNTER — Ambulatory Visit (INDEPENDENT_AMBULATORY_CARE_PROVIDER_SITE_OTHER): Payer: BC Managed Care – PPO | Admitting: Cardiovascular Disease

## 2021-09-02 ENCOUNTER — Encounter: Payer: Self-pay | Admitting: Cardiovascular Disease

## 2021-09-02 VITALS — BP 110/60 | HR 87 | Ht 60.0 in | Wt 133.0 lb

## 2021-09-02 DIAGNOSIS — I471 Supraventricular tachycardia, unspecified: Secondary | ICD-10-CM

## 2021-09-02 DIAGNOSIS — I4719 Other supraventricular tachycardia: Secondary | ICD-10-CM

## 2021-09-02 MED ORDER — PROPRANOLOL HCL 20 MG PO TABS: 20.0000 mg | ORAL_TABLET | Freq: Three times a day (TID) | ORAL | 3 refills | Status: DC

## 2021-09-02 NOTE — Patient Instructions (Addendum)
Research Kardia mobile or smart watch  Medication Instructions:   Please try Propanolol 10-20 mg as needed Up to 3 times a day For fast heart rates/palpitations   If you need a refill on your cardiac medications before your next appointment, please call your pharmacy.   Lab work: No new labs needed  Testing/Procedures: No new testing needed  Follow-Up: At [facility], you and your health needs are our priority.  As part of our continuing mission to provide you with exceptional heart care, we have created designated Provider Care Teams.  These Care Teams include your primary Cardiologist (physician) and Advanced Practice Providers (APPs -  Physician Assistants and Nurse Practitioners) who all work together to provide you with the care you need, when you need it.  You will need a follow up appointment as needed  Providers on your designated Care Team:   Nicolasa Ducking, NP Eula Listen, PA-C Marisue Ivan, PA-C Cadence Maybeury, [place]  COVID-19 Vaccine Information can be found at: [email or web address] For questions related to vaccine distribution or appointments, please email [email or web address] or call [phone].

## 2021-09-16 ENCOUNTER — Telehealth: Payer: Self-pay | Admitting: Physician Assistant

## 2021-09-16 NOTE — Telephone Encounter (Signed)
Received a page after hours for BP of 134/81 in setting of nose bleed. I have discussed that this is normal BP. Nose bleed has resolved.

## 2021-10-22 ENCOUNTER — Ambulatory Visit
Admission: EM | Admit: 2021-10-22 | Discharge: 2021-10-22 | Disposition: A | Discharge status: Home / Self Care | Payer: BC Managed Care – PPO | Attending: Emergency Medicine | Admitting: Emergency Medicine

## 2021-10-22 ENCOUNTER — Other Ambulatory Visit: Payer: Self-pay

## 2021-10-22 DIAGNOSIS — Z8269 Family history of other diseases of the musculoskeletal system and connective tissue: Secondary | ICD-10-CM | POA: Diagnosis not present

## 2021-10-22 DIAGNOSIS — R21 Rash and other nonspecific skin eruption: Secondary | ICD-10-CM

## 2021-10-22 DIAGNOSIS — M2550 Pain in unspecified joint: Secondary | ICD-10-CM

## 2021-10-22 MED ORDER — METHYLPREDNISOLONE 4 MG PO TBPK: ORAL_TABLET | ORAL | 0 refills | Status: DC

## 2021-10-22 NOTE — ED Triage Notes (Signed)
Pt reports she is having body aches, and abd pain. Pt states she has a rash to her face (cheeks and nose).  Started: almost a years ago per pt

## 2021-10-22 NOTE — Discharge Instructions (Addendum)
After looking into this further, I believe it would be a good idea to perform some preliminary screening for lupus on you today  The results of these test will be posted to your MyChart, they will not alter anything that we would do for you here at urgent care but can certainly expedite the process of obtaining diagnosis and moving onto treatment.  The results to be provided to your new primary care provider, please be on the look out for a phone call helping you to set up an appointment soon as possible.  Thank you for visiting urgent care today.  As we discussed, I am happy to provide you with a short course of steroids to address your current symptoms.  I recommend a 6-day course no more, this is provided as a tapering dose so that you know that when it is complete whether or not it was completely effective.  Please take 1 row of tablets daily with your breakfast, take them all at once, do not try to space them out over the day as they will be less effective.

## 2021-10-22 NOTE — ED Provider Notes (Signed)
[facility]    CSN: [id number] Arrival date & time: 10/22/21  0947   History   Chief Complaint Chief Complaint  Patient presents with   Generalized Body Aches   HPI Nancy Page is a 20 y.o. female. Patient complains of having body aches that started primarily in her neck and radiate down to her legs.  Patient says she is also been having intermittent abdominal pain.  Patient states that she frequently has a rash on her face that is on both upper cheeks and across her nose, states she puts hydrocortisone on it and it tends to get well controlled.  Patient states she is been having all the symptoms for well over a year, patient states she does not currently have a primary care provider and usually just goes to urgent care to request a prescription for prednisone, states they usually give it to her.  Patient states she has tried ibuprofen for her pain but states that it does not work very well.  Patient states that her father had lupus.  Patient states she is never been tested for autoimmune disease.  Patient states she does not know what her father symptoms were.   The history is provided by the patient.   History reviewed. No pertinent past medical history. There are no problems to display for this patient.  History reviewed. No pertinent surgical history. OB History   No obstetric history on file.    Home Medications    Prior to Admission medications   Medication Sig Start Date End Date Taking? Authorizing Provider  methylPREDNISolone (MEDROL DOSEPAK) 4 MG TBPK tablet Take 24 mg on day 1, 20 mg on day 2, 16 mg on day 3, 12 mg on day 4, 8 mg on day 5, 4 mg on day 6. 10/22/21  Yes Theadora Rama Scales, PA-C  albuterol (VENTOLIN HFA) 108 (90 Base) MCG/ACT inhaler Inhale 2 puffs into the lungs every 6 (six) hours as needed. 04/09/21   [provider]  propranolol (INDERAL) 20 MG tablet Take 1 tablet (20 mg total) by mouth 3 (three) times daily. Can take 10-20 mg as  needed 09/02/21   Antonieta Iba, MD  fluticasone (FLONASE) 50 MCG/ACT nasal spray Place 2 sprays into both nostrils daily. 10/03/17 03/18/21  Belinda Fisher, PA-C  levonorgestrel-ethinyl estradiol (SEASONALE,INTROVALE,JOLESSA) 0.15-0.03 MG tablet Take 1 tablet by mouth daily.  03/18/21  [provider]   Family History Family History  Problem Relation Age of Onset   Healthy Mother    Healthy Father    Heart murmur Brother    Social History Social History   Tobacco Use   Smoking status: Never   Smokeless tobacco: Never  Vaping Use   Vaping Use: Never used  Substance Use Topics   Alcohol use: Yes    Comment: occasionally   Drug use: Never   Allergies   Cephalexin, Milk-related compounds, Wheat bran, and Other  Review of Systems Review of Systems Pertinent findings noted in history of present illness.   Physical Exam Triage Vital Signs ED Triage Vitals  Enc Vitals Group     BP 10/03/21 0827 (!) 147/82     Pulse Rate 10/03/21 0827 72     Resp 10/03/21 0827 18     Temp 10/03/21 0827 98.3 F (36.8 C)     Temp Source 10/03/21 0827 Oral     SpO2 10/03/21 0827 98 %     Weight --      Height --  Head Circumference --      Peak Flow --      Pain Score 10/03/21 0826 5     Pain Loc --      Pain Edu? --      Excl. in [place]? --    No data found.  Updated Vital Signs BP 121/82 (BP Location: Right Arm)   Pulse 79   Temp 99.1 F (37.3 C) (Oral)   Resp 18   LMP 10/07/2021   SpO2 98%   Visual Acuity Right Eye Distance:   Left Eye Distance:   Bilateral Distance:    Right Eye Near:   Left Eye Near:    Bilateral Near:     Physical Exam Vitals and nursing note reviewed.  Constitutional:      General: She is not in acute distress.    Appearance: Normal appearance. She is not ill-appearing.  HENT:     Head: Normocephalic and atraumatic.  Eyes:     General: Lids are normal.        Right eye: No discharge.        Left eye: No discharge.     Extraocular  Movements: Extraocular movements intact.     Conjunctiva/sclera: Conjunctivae normal.     Right eye: Right conjunctiva is not injected.     Left eye: Left conjunctiva is not injected.  Neck:     Trachea: Trachea and phonation normal.  Cardiovascular:     Rate and Rhythm: Normal rate and regular rhythm.     Pulses: Normal pulses.     Heart sounds: Normal heart sounds. No murmur heard.   No friction rub. No gallop.  Pulmonary:     Effort: Pulmonary effort is normal. No accessory muscle usage, prolonged expiration or respiratory distress.     Breath sounds: Normal breath sounds. No stridor, decreased air movement or transmitted upper airway sounds. No decreased breath sounds, wheezing, rhonchi or rales.  Chest:     Chest wall: No tenderness.  Musculoskeletal:        General: Tenderness present. No swelling, deformity or signs of injury. Normal range of motion.     Cervical back: Normal range of motion and neck supple. Normal range of motion.     Right lower leg: No edema.     Left lower leg: No edema.  Lymphadenopathy:     Cervical: No cervical adenopathy.  Skin:    General: Skin is warm and dry.     Findings: No erythema or rash.  Neurological:     General: No focal deficit present.     Mental Status: She is alert and oriented to person, place, and time.  Psychiatric:        Mood and Affect: Mood normal.        Behavior: Behavior normal.   UC Treatments / Results  Labs (all labs ordered are listed, but only abnormal results are displayed)  Labs Reviewed  CBC WITH DIFFERENTIAL/PLATELET  SEDIMENTATION RATE  C-REACTIVE PROTEIN  BASIC METABOLIC PANEL  ANTINUCLEAR ANTIBODIES, IFA    EKG  Radiology No results found.  Procedures Procedures (including critical care time)  Medications Ordered in UC Medications - No data to display  Initial Impression / Assessment and Plan / UC Course  I have reviewed the triage vital signs and the nursing notes.  Pertinent labs &  imaging results that were available during my care of the patient were reviewed by me and considered in my medical decision making (see chart for details).  Patient will clearly need to meet more criteria for firm diagnosis of lupus.  In the interest of time and patient's duration of symptoms, I have decided to perform preliminary testing.  I have also initiated a request to help patient find primary care provider.  The results of her testing today will be provided to her new provider.  I provided patient with a 6-day tapering course of steroids to address her current symptoms.  Patient advised to resort to steroid treatment as little as possible due to their adverse effects. Final Clinical Impressions(s) / UC Diagnoses   Final diagnoses:  Malar rash  Multiple joint pain  Family history of systemic lupus erythematosus     Discharge Instructions      After looking into this further, I believe it would be a good idea to perform some preliminary screening for lupus on you today  The results of these test will be posted to your MyChart, they will not alter anything that we would do for you here at urgent care but can certainly expedite the process of obtaining diagnosis and moving onto treatment.  The results to be provided to your new primary care provider, please be on the look out for a phone call helping you to set up an appointment soon as possible.  Thank you for visiting urgent care today.  As we discussed, I am happy to provide you with a short course of steroids to address your current symptoms.  I recommend a 6-day course no more, this is provided as a tapering dose so that you know that when it is complete whether or not it was completely effective.  Please take 1 row of tablets daily with your breakfast, take them all at once, do not try to space them out over the day as they will be less effective.     ED Prescriptions     Medication Sig Dispense Auth. Provider    methylPREDNISolone (MEDROL DOSEPAK) 4 MG TBPK tablet Take 24 mg on day 1, 20 mg on day 2, 16 mg on day 3, 12 mg on day 4, 8 mg on day 5, 4 mg on day 6. 21 tablet Lynden Oxford Scales, PA-C      PDMP not reviewed this encounter.  Disposition Upon Discharge:  Patient presented with an acute illness with associated systemic symptoms and significant discomfort requiring urgent management. In my opinion, this is a condition that a prudent lay person (someone who possesses an average knowledge of health and medicine) may potentially expect to result in complications if not addressed urgently such as respiratory distress, impairment of bodily function or dysfunction of bodily organs.   Routine symptom specific, illness specific and/or disease specific instructions were discussed with the patient and/or caregiver at length.   As such, the patient has been evaluated and assessed, work-up was performed and treatment was provided in alignment with urgent care protocols and evidence based medicine.  Patient/parent/caregiver has been advised that the patient may require follow up for further testing and treatment if the symptoms continue in spite of treatment, as clinically indicated and appropriate.  Patient/parent/caregiver has been advised to return to the [facility] or PCP in 3-5 days if no better; to PCP or the Emergency Department if new signs and symptoms develop, or if the current signs or symptoms continue to change or worsen for further workup, evaluation and treatment as clinically indicated and appropriate  The patient will follow up with their current PCP if and as advised. If the  patient does not currently have a PCP we will assist them in obtaining one.   The patient may need specialty follow up if the symptoms continue, in spite of conservative treatment and management, for further workup, evaluation, consultation and treatment as clinically indicated and appropriate.  Patient/parent/caregiver  verbalized understanding and agreement of plan as discussed.  All questions were addressed during visit.  Please see discharge instructions below for further details of plan.  Condition: stable for discharge home Home: take medications as prescribed; routine discharge instructions as discussed; follow up as advised.    Theadora Rama Scales, PA-C 10/22/21 1135

## 2021-10-23 LAB — SEDIMENTATION RATE

## 2021-10-24 ENCOUNTER — Ambulatory Visit
Admission: EM | Admit: 2021-10-24 | Discharge: 2021-10-24 | Disposition: A | Discharge status: Home / Self Care | Payer: BC Managed Care – PPO | Attending: Emergency Medicine | Admitting: Emergency Medicine

## 2021-10-24 ENCOUNTER — Other Ambulatory Visit: Payer: Self-pay

## 2021-10-24 DIAGNOSIS — M2550 Pain in unspecified joint: Secondary | ICD-10-CM | POA: Diagnosis not present

## 2021-10-24 LAB — CBC WITH DIFFERENTIAL/PLATELET

## 2021-10-24 LAB — BASIC METABOLIC PANEL
BUN/Creatinine Ratio: 11 (ref 9–23)
BUN: 9 mg/dL (ref 6–20)
CO2: 21 mmol/L (ref 20–29)
Calcium: 9.4 mg/dL (ref 8.7–10.2)
Chloride: 98 mmol/L (ref 96–106)
Creatinine, Ser: 0.8 mg/dL (ref 0.57–1.00)
Glucose: 92 mg/dL (ref 70–99)
Potassium: 4.7 mmol/L (ref 3.5–5.2)
Sodium: 135 mmol/L (ref 134–144)
eGFR: 107 mL/min/{1.73_m2} (ref >59)

## 2021-10-24 LAB — ANTINUCLEAR ANTIBODIES, IFA: ANA Titer 1: POSITIVE — AB

## 2021-10-24 LAB — FANA STAINING PATTERNS: Homogeneous Pattern: 1:80 {titer}

## 2021-10-24 LAB — C-REACTIVE PROTEIN: CRP: 2 mg/L (ref 0–10)

## 2021-10-24 NOTE — ED Triage Notes (Signed)
Patient presents for redraw of the blood work from recent visit tjhat [facility] states was frozen upon arrival.

## 2021-10-25 LAB — CBC WITH DIFFERENTIAL/PLATELET
Basophils Absolute: 0 10*3/uL (ref 0.0–0.2)
Basos: 1 %
EOS (ABSOLUTE): 0 10*3/uL (ref 0.0–0.4)
Eos: 0 %
Hematocrit: 41 % (ref 34.0–46.6)
Hemoglobin: 13.6 g/dL (ref 11.1–15.9)
Immature Grans (Abs): 0 10*3/uL (ref 0.0–0.1)
Immature Granulocytes: 0 %
Lymphocytes Absolute: 2.8 10*3/uL (ref 0.7–3.1)
Lymphs: 35 %
MCH: 30 pg (ref 26.6–33.0)
MCHC: 33.2 g/dL (ref 31.5–35.7)
MCV: 91 fL (ref 79–97)
Monocytes Absolute: 0.6 10*3/uL (ref 0.1–0.9)
Monocytes: 8 %
Neutrophils Absolute: 4.4 10*3/uL (ref 1.4–7.0)
Neutrophils: 56 %
Platelets: 358 10*3/uL (ref 150–450)
RBC: 4.53 x10E6/uL (ref 3.77–5.28)
RDW: 12.8 % (ref 11.7–15.4)
WBC: 7.8 10*3/uL (ref 3.4–10.8)

## 2021-10-25 LAB — SEDIMENTATION RATE: Sed Rate: 11 mm/hr (ref 0–32)

## 2021-12-07 NOTE — L&D Delivery Note (Addendum)
Delivery Note At 7:12 AM a viable female was delivered by Dr. Christianne Dolin via Vaginal, Spontaneous (Presentation: Middle Occiput Anterior).  APGAR: 8, 9; weight  6lbs 3oz.   Placenta status: Spontaneous, Intact.  Cord: 3 vessels with the following complications: None.  Cord pH: n/a  Anesthesia: Epidural Episiotomy: None Lacerations: 3rd degree repaired by me to a second degree and Dr. Normand Sloop finished the repair.  Bilateral labial lacs repaired with 3-0 vicryl Suture Repair:  0 vicryl, 2-0 vicryl, 3-0 vicryl Est. Blood Loss (mL):  433  Mom to postpartum.  Baby to Couplet care / Skin to Skin.  Nancy Page 11/02/2022, 8:53 AM

## 2022-01-29 ENCOUNTER — Ambulatory Visit (INDEPENDENT_AMBULATORY_CARE_PROVIDER_SITE_OTHER): Payer: BC Managed Care – PPO | Admitting: Nurse Practitioner

## 2022-01-29 ENCOUNTER — Other Ambulatory Visit: Payer: Self-pay

## 2022-01-29 ENCOUNTER — Encounter: Payer: Self-pay | Admitting: Nurse Practitioner

## 2022-01-29 VITALS — BP 108/72 | HR 70 | Temp 98.3°F | Ht 60.0 in | Wt 127.4 lb

## 2022-01-29 DIAGNOSIS — Z114 Encounter for screening for human immunodeficiency virus [HIV]: Secondary | ICD-10-CM | POA: Diagnosis not present

## 2022-01-29 DIAGNOSIS — R4189 Other symptoms and signs involving cognitive functions and awareness: Secondary | ICD-10-CM | POA: Diagnosis not present

## 2022-01-29 DIAGNOSIS — M2550 Pain in unspecified joint: Secondary | ICD-10-CM

## 2022-01-29 DIAGNOSIS — Z1159 Encounter for screening for other viral diseases: Secondary | ICD-10-CM | POA: Diagnosis not present

## 2022-01-29 DIAGNOSIS — R5383 Other fatigue: Secondary | ICD-10-CM | POA: Diagnosis not present

## 2022-01-29 DIAGNOSIS — Z8616 Personal history of COVID-19: Secondary | ICD-10-CM

## 2022-01-29 DIAGNOSIS — R768 Other specified abnormal immunological findings in serum: Secondary | ICD-10-CM | POA: Diagnosis not present

## 2022-01-29 DIAGNOSIS — Z7689 Persons encountering health services in other specified circumstances: Secondary | ICD-10-CM

## 2022-01-29 NOTE — Progress Notes (Signed)
I,Tianna Badgett,acting as a Education administrator for [employer], FNP.,have documented all relevant documentation on the behalf of Minette Brine, FNP,as directed by  Minette Brine, FNP while in the presence of Minette Brine, [place].  This visit occurred during the SARS-CoV-2 public health emergency.  Safety protocols were in place, including screening questions prior to the visit, additional usage of staff PPE, and extensive cleaning of exam room while observing appropriate contact time as indicated for disinfecting solutions.  Subjective:     Patient ID: Nancy Page , female    DOB: 17-Feb-2001 , 21 y.o.   MRN: [id number]   Chief Complaint  Patient presents with   Establish Care    HPI  Pt presents today to est care, she has not had a primary care provider since seeing a pediatrician. She does not remember the providers name. Works as a Glass blower/designer. She has some college education, plans to go back in the summer and has her high school diploma. Single. No children.   PMH - she has been having pain, fatigue and brain fog. She has been to urgent care due to bronchitis, last time went to urgent care her ANA was positive. She has joint pain, feet and hands will swell. She does not have a tight grip. When she is in the sun too much she will get spots on her arms. Also her face/cheeks will get red.  She works day shift. No matter how much she sleeps she is still tired. Will take nap 2-3 hours. She will try to stay up and will still be tired. Her symptoms have been ongoing since 2019. The "flares" are occurring more frequently.   Occasional alcohol drinker. Denies marijuana.  She went to a Cardiologist due to having SVT and Atrial Tachycardia. She was given propanolol as needed. Has not had to take the medication she did do the valsalva maneuver.   She thinks her dad has SLE but is unsure if he has been diagnosed. Maternal grandmother - diabetes. Paternal grandmother - cancer possibly cervical.   She has  been on depo then seasonique in the past.   LMP 01/27/2022.      History reviewed. No pertinent past medical history.   Family History  Problem Relation Age of Onset   Healthy Mother    Healthy Father    Heart murmur Brother    Diabetes Maternal Grandmother    Cancer Paternal Grandmother      Current Outpatient Medications:    albuterol (VENTOLIN HFA) 108 (90 Base) MCG/ACT inhaler, Inhale 2 puffs into the lungs every 6 (six) hours as needed., Disp: , Rfl:    propranolol (INDERAL) 20 MG tablet, Take 1 tablet (20 mg total) by mouth 3 (three) times daily. Can take 10-20 mg as needed, Disp: 30 tablet, Rfl: 3   Allergies  Allergen Reactions   Cephalexin Hives   Milk-Related Compounds Hives   Wheat Bran Hives   Other Rash     Review of Systems  Constitutional:  Positive for fatigue.  Respiratory: Negative.    Cardiovascular: Negative.   Skin:  Positive for rash.  Neurological: Negative.   Psychiatric/Behavioral: Negative.      Today's Vitals   01/29/22 1420  BP: 108/72  Pulse: 70  Temp: 98.3 F (36.8 C)  Weight: 127 lb 6.4 oz (57.8 kg)  Height: 5' (1.524 m)  PainSc: 0-No pain   Body mass index is 24.88 kg/m.   Objective:  Physical Exam Vitals reviewed.  Constitutional:  General: She is not in acute distress.    Appearance: Normal appearance.  Cardiovascular:     Rate and Rhythm: Normal rate and regular rhythm.     Pulses: Normal pulses.     Heart sounds: Normal heart sounds. No murmur heard. Pulmonary:     Effort: Pulmonary effort is normal. No respiratory distress.     Breath sounds: Normal breath sounds. No wheezing.  Skin:    Capillary Refill: Capillary refill takes less than 2 seconds.  Neurological:     General: No focal deficit present.     Mental Status: She is alert and oriented to person, place, and time.     Cranial Nerves: No cranial nerve deficit.     Motor: No weakness.  Psychiatric:        Mood and Affect: Mood normal.         Behavior: Behavior normal.        Thought Content: Thought content normal.        Judgment: Judgment normal.        Assessment And Plan:     1. Other fatigue Comments: Will check for metabolic causes.  - Iron, TIBC and Ferritin Panel - Vitamin B12 - TSH - Ambulatory referral to Rheumatology - Rheumatoid (RA) Factor  2. Arthralgia, unspecified joint - Vitamin B12 - Ambulatory referral to Rheumatology - Rheumatoid (RA) Factor - CRP (C-Reactive Protein)  3. Brain fog - Iron, TIBC and Ferritin Panel - Vitamin B12 - VITAMIN D 25 Hydroxy (Vit-D Deficiency, Fractures) - TSH - Ambulatory referral to Rheumatology - Rheumatoid (RA) Factor  4. Positive ANA (antinuclear antibody) Comments: Has not been seen by Rheumatology will refer for further evaluation - Ambulatory referral to Rheumatology  5. History of COVID-19 Comments: Had covid 12/2020  6. Establishing care with new doctor, encounter for  7. Encounter for HIV (human immunodeficiency virus) test - HIV Antibody (routine testing w rflx)  8. Encounter for hepatitis C screening test for low risk patient Will check Hepatitis C screening due to recent recommendations to screen all adults 18 years and older - Hepatitis C antibody     Patient was given opportunity to ask questions. Patient verbalized understanding of the plan and was able to repeat key elements of the plan. All questions were answered to their satisfaction.  Minette Brine, FNP   I, Minette Brine, FNP, have reviewed all documentation for this visit. The documentation on 02/10/22 for the exam, diagnosis, procedures, and orders are all accurate and complete.   IF YOU HAVE BEEN REFERRED TO A SPECIALIST, IT MAY TAKE 1-2 WEEKS TO SCHEDULE/PROCESS THE REFERRAL. IF YOU HAVE NOT HEARD FROM US/SPECIALIST IN TWO WEEKS, PLEASE GIVE [place] A CALL AT [phone].   THE PATIENT IS ENCOURAGED TO PRACTICE SOCIAL DISTANCING DUE TO THE COVID-19 PANDEMIC.

## 2022-01-29 NOTE — Patient Instructions (Addendum)
Fatigue °If you have fatigue, you feel tired all the time and have a lack of energy or a lack of motivation. Fatigue may make it difficult to start or complete tasks because of exhaustion. In general, occasional or mild fatigue is often a normal response to activity or life. However, long-lasting (chronic) or extreme fatigue may be a symptom of a medical condition. °Follow these instructions at home: °General instructions °Watch your fatigue for any changes. °Go to bed and get up at the same time every day. °Avoid fatigue by pacing yourself during the day and getting enough sleep at night. °Maintain a healthy weight. °Medicines °Take over-the-counter and prescription medicines only as told by your health care provider. °Take a multivitamin, if told by your health care provider.  °Do not use herbal or dietary supplements unless they are approved by your health care provider. °Activity ° °Exercise regularly, as told by your health care provider. °Use or practice techniques to help you relax, such as yoga, tai chi, meditation, or massage therapy. °Eating and drinking ° °Avoid heavy meals in the evening. °Eat a well-balanced diet, which includes lean proteins, whole grains, plenty of fruits and vegetables, and low-fat dairy products. °Avoid consuming too much caffeine. °Avoid the use of alcohol. °Drink enough fluid to keep your urine pale yellow. °Lifestyle °Change situations that cause you stress. Try to keep your work and personal schedule in balance. °Do not use any products that contain nicotine or tobacco, such as cigarettes and e-cigarettes. If you need help quitting, ask your health care provider. °Do not use drugs. °Contact a health care provider if: °Your fatigue does not get better. °You have a fever. °You suddenly lose or gain weight. °You have headaches. °You have trouble falling asleep or sleeping through the night. °You feel angry, guilty, anxious, or sad. °You are unable to have a bowel movement  (constipation). °Your skin is dry. °You have swelling in your legs or another part of your body. °Get help right away if: °You feel confused. °Your vision is blurry. °You feel faint or you pass out. °You have a severe headache. °You have severe pain in your abdomen, your back, or the area between your waist and hips (pelvis). °You have chest pain, shortness of breath, or an irregular or fast heartbeat. °You are unable to urinate, or you urinate less than normal. °You have abnormal bleeding, such as bleeding from the rectum, vagina, nose, lungs, or nipples. °You vomit blood. °You have thoughts about hurting yourself or others. °If you ever feel like you may hurt yourself or others, or have thoughts about taking your own life, get help right away. You can go to your nearest emergency department or call: °Your local emergency services (911 in the [place] °A suicide crisis helpline, such as the National Suicide Prevention Lifeline at 1-800-273-8255 or 988 in the [place] This is open 24 hours a day. °Summary °If you have fatigue, you feel tired all the time and have a lack of energy or a lack of motivation. °Fatigue may make it difficult to start or complete tasks because of exhaustion. °Long-lasting (chronic) or extreme fatigue may be a symptom of a medical condition. °Exercise regularly, as told by your health care provider. °Change situations that cause you stress. Try to keep your work and personal schedule in balance. °This information is not intended to replace advice given to you by your health care provider. Make sure you discuss any questions you have with your health care provider. °Document Revised:   06/18/2021 Document Reviewed: 10/03/2020 Elsevier Patient Education  2022 [employer].   I will refer you to a Rheumatologist and check additional labs.

## 2022-01-30 LAB — IRON,TIBC AND FERRITIN PANEL
Ferritin: 79 ng/mL (ref 15–150)
Iron Saturation: 15 % (ref 15–55)
Iron: 50 ug/dL (ref 27–159)
Total Iron Binding Capacity: 324 ug/dL (ref 250–450)
UIBC: 274 ug/dL (ref 131–425)

## 2022-01-30 LAB — C-REACTIVE PROTEIN: CRP: 1 mg/L (ref 0–10)

## 2022-01-30 LAB — VITAMIN B12: Vitamin B-12: 605 pg/mL (ref 232–1245)

## 2022-01-30 LAB — TSH: TSH: 0.496 u[IU]/mL (ref 0.450–4.500)

## 2022-01-30 LAB — VITAMIN D 25 HYDROXY (VIT D DEFICIENCY, FRACTURES): Vit D, 25-Hydroxy: 11.8 ng/mL — ABNORMAL LOW (ref 30.0–100.0)

## 2022-01-30 LAB — RHEUMATOID FACTOR: Rheumatoid fact SerPl-aCnc: <10 IU/mL (ref <14.0)

## 2022-01-30 LAB — HIV ANTIBODY (ROUTINE TESTING W REFLEX): HIV Screen 4th Generation wRfx: NONREACTIVE

## 2022-01-30 LAB — HEPATITIS C ANTIBODY: Hep C Virus Ab: NONREACTIVE

## 2022-03-03 ENCOUNTER — Other Ambulatory Visit: Payer: BC Managed Care – PPO

## 2022-03-03 ENCOUNTER — Other Ambulatory Visit: Payer: Self-pay

## 2022-03-03 DIAGNOSIS — Z3200 Encounter for pregnancy test, result unknown: Secondary | ICD-10-CM | POA: Diagnosis not present

## 2022-03-04 ENCOUNTER — Other Ambulatory Visit: Payer: Self-pay | Admitting: Nurse Practitioner

## 2022-03-04 ENCOUNTER — Encounter: Payer: Self-pay | Admitting: Nurse Practitioner

## 2022-03-04 DIAGNOSIS — Z3201 Encounter for pregnancy test, result positive: Secondary | ICD-10-CM

## 2022-03-04 LAB — BETA HCG QUANT (REF LAB): hCG Quant: 1883 m[IU]/mL

## 2022-03-04 NOTE — Progress Notes (Signed)
Called patient to inform of her positive blood pregnancy test, discussed the next steps to see OB/GYN she would like to go to [facility].  She is advised to get an over the counter prenatal vitamin and to only take Tylenol if has pain at this time. Also given phone number for Dr. Corliss Skains.   ?

## 2022-04-07 DIAGNOSIS — Z369 Encounter for antenatal screening, unspecified: Secondary | ICD-10-CM | POA: Diagnosis not present

## 2022-04-07 DIAGNOSIS — N925 Other specified irregular menstruation: Secondary | ICD-10-CM | POA: Diagnosis not present

## 2022-04-07 DIAGNOSIS — Z3A10 10 weeks gestation of pregnancy: Secondary | ICD-10-CM | POA: Diagnosis not present

## 2022-04-07 DIAGNOSIS — N912 Amenorrhea, unspecified: Secondary | ICD-10-CM | POA: Diagnosis not present

## 2022-04-07 DIAGNOSIS — Z113 Encounter for screening for infections with a predominantly sexual mode of transmission: Secondary | ICD-10-CM | POA: Diagnosis not present

## 2022-04-13 LAB — OB RESULTS CONSOLE GC/CHLAMYDIA
Chlamydia: NEGATIVE
Neisseria Gonorrhea: NEGATIVE

## 2022-04-17 NOTE — Progress Notes (Signed)
Office Visit Note  Patient: Nancy SloopMariani D Jemmott             Date of Birth: 04/09/2001           MRN: [id number]             PCP: Arnette FeltsMoore, Janece, FNP Referring: Arnette FeltsMoore, Janece, FNP Visit Date: 04/29/2022 Occupation: @GUAROCC @  Subjective:  Fatigue and positive ANA  History of Present Illness: Nancy Page is a 21 y.o. female seen in consultation per request of her PCP.  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  She states while she was working at the labs she started experiencing increased fatigue, weakness, shortness of breath and frequent cough.  She states she was seen at the urgent care about once every 2 months and was diagnosed with bronchitis.  She was frequently given prednisone taper and intramuscular steroid shots and inhalers.  She started working from home in May 2022.  Since then her cough improved.  Although she continues to have fatigue weakness brain fog and pain in her extremities which she describes in her arms and her legs.  She denies any joint pain.  She also developed intermittent rash on her face.  She was seen at the urgent care who referred her to her primary care doctor.  Her labs were done which were positive for ANA.  For that reason she was referred to me.  She states now she is [redacted] weeks pregnant.  Since she has been pregnant she still continues to have fatigue but she has not noticed any muscle pain.  She has been experiencing some lower back and hip pain.  She states the brain fog has been better.  She denies any history of oral ulcers, nasal ulcers, sicca symptoms, Raynaud's phenomenon or lymphadenopathy.  There is no history of photosensitivity.  There is no family history of autoimmune disease.  She is gravida 3, para 1, miscarriages 2.  There is no history of DVTs.  Activities of Daily Living:  Patient reports morning stiffness for 0  none .   Patient Denies nocturnal pain.  Difficulty dressing/grooming: Denies Difficulty climbing  stairs: Denies Difficulty getting out of chair: Reports Difficulty using hands for taps, buttons, cutlery, and/or writing: Denies  Review of Systems  Constitutional:  Positive for fatigue.  HENT:  Negative for mouth sores and mouth dryness.   Eyes:  Negative for dryness.  Respiratory:  Negative for shortness of breath.   Cardiovascular:  Negative for swelling in legs/feet.  Gastrointestinal:  Positive for constipation and diarrhea.  Endocrine: Positive for cold intolerance and increased urination.  Genitourinary:  Negative for difficulty urinating.  Musculoskeletal:  Positive for myalgias, muscle weakness and myalgias. Negative for gait problem.  Skin:  Positive for rash. Negative for color change and sensitivity to sunlight.  Allergic/Immunologic: Positive for susceptible to infections.  Neurological:  Positive for weakness. Negative for numbness.  Hematological:  Positive for bruising/bleeding tendency. Negative for swollen glands.  Psychiatric/Behavioral:  Positive for sleep disturbance. Negative for depressed mood. The patient is nervous/anxious.    PMFS History:  There are no problems to display for this patient.   History reviewed. No pertinent past medical history.  Family History  Problem Relation Age of Onset   Hypertension Mother    Lupus Father    Heart murmur Brother    Diabetes Maternal Grandmother    Cancer Paternal Grandmother    History reviewed. No pertinent surgical history. Social History  Social History Narrative   Not on file    There is no immunization history on file for this patient.   Objective: Vital Signs: BP 112/73 (BP Location: Right Arm, Patient Position: Sitting, Cuff Size: Normal)   Pulse (!) 102   Resp 14   Ht 5' 0.25" (1.53 m)   Wt 127 lb (57.6 kg)   LMP 10/07/2021   BMI 24.60 kg/m    Physical Exam Vitals and nursing note reviewed.  Constitutional:      Appearance: She is well-developed.  HENT:     Head: Normocephalic and  atraumatic.  Eyes:     Conjunctiva/sclera: Conjunctivae normal.  Cardiovascular:     Rate and Rhythm: Normal rate and regular rhythm.     Heart sounds: Normal heart sounds.  Pulmonary:     Effort: Pulmonary effort is normal.     Breath sounds: Normal breath sounds.  Abdominal:     General: Bowel sounds are normal.     Palpations: Abdomen is soft.  Musculoskeletal:     Cervical back: Normal range of motion.  Lymphadenopathy:     Cervical: No cervical adenopathy.  Skin:    General: Skin is warm and dry.     Capillary Refill: Capillary refill takes less than 2 seconds.  Neurological:     Mental Status: She is alert and oriented to person, place, and time.  Psychiatric:        Behavior: Behavior normal.     Musculoskeletal Exam: C-spine was in good range of motion.  Thoracic and lumbar spine were in good range of motion.  She had some discomfort in the lower lumbar paravertebral region.  No SI joint tenderness was noted.  Shoulder joints, elbow joints, wrist joints, MCPs PIP DIP and DIPs with good range of motion with no synovitis.  Hip joints, knee joints, ankles, MTPs and PIPs with good range of motion with no synovitis.  She had generalized hyperalgesia and tender points.  CDAI Exam: CDAI Score: -- Patient Global: --; Provider Global: -- Swollen: --; Tender: -- Joint Exam 04/29/2022   No joint exam has been documented for this visit   There is currently no information documented on the homunculus. Go to the Rheumatology activity and complete the homunculus joint exam.  Investigation: No additional findings.  Imaging: No results found.  Recent Labs: Lab Results  Component Value Date   WBC 7.8 10/24/2021   HGB 13.6 10/24/2021   PLT 358 10/24/2021   NA 135 10/22/2021   K 4.7 10/22/2021   CL 98 10/22/2021   CO2 21 10/22/2021   GLUCOSE 92 10/22/2021   BUN 9 10/22/2021   CREATININE 0.80 10/22/2021   CALCIUM 9.4 10/22/2021    Speciality Comments: No specialty  comments available.  Procedures:  No procedures performed Allergies: Cephalexin, Milk-related compounds, Wheat bran, and Other   Assessment / Plan:     Visit Diagnoses: Positive ANA (antinuclear antibody) - 01/29/22: CRP 1, HIV-, Hep C-, RF<10, TSH 0.496, vitamin D 11.8, vitamin B12 605.10/22/21: ANA 1:80H. CRP 2 -she has low titer positive ANA.  She also gives history of facial rash and fatigue.  There is no history of oral ulcers, nasal ulcers, sicca symptoms, inflammatory arthritis, lymphadenopathy.  We will obtain additional labs today.  There is no family history of autoimmune disease.  She gives history of miscarriages x2 in the past.  Plan: ANA, Anti-scleroderma antibody, RNP Antibody, Anti-Smith antibody, Sjogrens syndrome-A extractable nuclear antibody, Sjogrens syndrome-B extractable nuclear antibody, Anti-DNA antibody, double-stranded,  C3 and C4, Beta-2 glycoprotein antibodies, Cardiolipin antibodies, IgG, IgM, IgA, Lupus Anticoagulant Eval w/Reflex, Cyclic citrul peptide antibody, IgG, COMPLETE METABOLIC PANEL WITH GFR, Urinalysis, Routine w reflex microscopic  Myalgia -she complains of muscle pain.  Plan: CK  Other fatigue-she complains of increased fatigue.  I noticed that her vitamin D is very low.  I advised her to contact her OB so she can start vitamin D.  Chronic midline low back pain without sciatica-complains of some lower back pain since she has been pregnant.  The pain appears to be muscular.  A handout on back exercises was given.  [redacted] weeks gestation of pregnancy-she is gravida 3 para 2, miscarriages 2.  Her pregnancy is going well currently per patient.  Vitamin D deficiency-vitamin D was 11.8 on November 28, 2022.  She is not taking any supplements.  Brain fog-improved per patient since she has been pregnant.  Seasonal allergies-she has had allergies since she was working in the lab.  She states allergies have been better since she has been working through home.  She is  currently unemployed.  Orders: Orders Placed This Encounter  Procedures   CK   ANA   Anti-scleroderma antibody   RNP Antibody   Anti-Smith antibody   Sjogrens syndrome-A extractable nuclear antibody   Sjogrens syndrome-B extractable nuclear antibody   Anti-DNA antibody, double-stranded   C3 and C4   Beta-2 glycoprotein antibodies   Cardiolipin antibodies, IgG, IgM, IgA   Lupus Anticoagulant Eval w/Reflex   Cyclic citrul peptide antibody, IgG   COMPLETE METABOLIC PANEL WITH GFR   Urinalysis, Routine w reflex microscopic   No orders of the defined types were placed in this encounter.    Follow-Up Instructions: Return for +ANA, fatigue.   Pollyann Savoy, MD  Note - This record has been created using Animal nutritionist.  Chart creation errors have been sought, but may not always  have been located. Such creation errors do not reflect on  the standard of medical care.

## 2022-04-29 ENCOUNTER — Encounter: Payer: Self-pay | Admitting: Rheumatology

## 2022-04-29 ENCOUNTER — Ambulatory Visit (INDEPENDENT_AMBULATORY_CARE_PROVIDER_SITE_OTHER): Payer: BC Managed Care – PPO | Admitting: Rheumatology

## 2022-04-29 VITALS — BP 112/73 | HR 102 | Resp 14 | Ht 60.25 in | Wt 127.0 lb

## 2022-04-29 DIAGNOSIS — R5383 Other fatigue: Secondary | ICD-10-CM | POA: Diagnosis not present

## 2022-04-29 DIAGNOSIS — J302 Other seasonal allergic rhinitis: Secondary | ICD-10-CM

## 2022-04-29 DIAGNOSIS — M545 Low back pain, unspecified: Secondary | ICD-10-CM | POA: Diagnosis not present

## 2022-04-29 DIAGNOSIS — R4189 Other symptoms and signs involving cognitive functions and awareness: Secondary | ICD-10-CM

## 2022-04-29 DIAGNOSIS — M2550 Pain in unspecified joint: Secondary | ICD-10-CM

## 2022-04-29 DIAGNOSIS — M791 Myalgia, unspecified site: Secondary | ICD-10-CM | POA: Diagnosis not present

## 2022-04-29 DIAGNOSIS — Z3A13 13 weeks gestation of pregnancy: Secondary | ICD-10-CM

## 2022-04-29 DIAGNOSIS — R768 Other specified abnormal immunological findings in serum: Secondary | ICD-10-CM

## 2022-04-29 DIAGNOSIS — G8929 Other chronic pain: Secondary | ICD-10-CM

## 2022-04-29 DIAGNOSIS — E559 Vitamin D deficiency, unspecified: Secondary | ICD-10-CM

## 2022-05-08 LAB — COMPLETE METABOLIC PANEL WITH GFR
AG Ratio: 1.5 (calc) (ref 1.0–2.5)
ALT: 13 U/L (ref 6–29)
AST: 14 U/L (ref 10–30)
Albumin: 4.1 g/dL (ref 3.6–5.1)
Alkaline phosphatase (APISO): 74 U/L (ref 31–125)
BUN: 9 mg/dL (ref 7–25)
CO2: 23 mmol/L (ref 20–32)
Calcium: 9.5 mg/dL (ref 8.6–10.2)
Chloride: 106 mmol/L (ref 98–110)
Creat: 0.61 mg/dL (ref 0.50–0.96)
Globulin: 2.8 g/dL (calc) (ref 1.9–3.7)
Glucose, Bld: 68 mg/dL (ref 65–99)
Potassium: 4 mmol/L (ref 3.5–5.3)
Sodium: 139 mmol/L (ref 135–146)
Total Bilirubin: 0.2 mg/dL (ref 0.2–1.2)
Total Protein: 6.9 g/dL (ref 6.1–8.1)
eGFR: 129 mL/min/{1.73_m2} (ref >=60)

## 2022-05-08 LAB — URINALYSIS, ROUTINE W REFLEX MICROSCOPIC
Bilirubin Urine: NEGATIVE
Glucose, UA: NEGATIVE
Hgb urine dipstick: NEGATIVE
Ketones, ur: NEGATIVE
Leukocytes,Ua: NEGATIVE
Nitrite: NEGATIVE
Protein, ur: NEGATIVE
Specific Gravity, Urine: 1.02 (ref 1.001–1.035)
pH: 6 (ref 5.0–8.0)

## 2022-05-08 LAB — ANTI-SCLERODERMA ANTIBODY: Scleroderma (Scl-70) (ENA) Antibody, IgG: <1 AI

## 2022-05-08 LAB — ANTI-NUCLEAR AB-TITER (ANA TITER)
ANA TITER: 1:160 {titer} — ABNORMAL HIGH
ANA Titer 1: 1:80 {titer} — ABNORMAL HIGH

## 2022-05-08 LAB — C3 AND C4
C3 Complement: 160 mg/dL (ref 83–193)
C4 Complement: 29 mg/dL (ref 15–57)

## 2022-05-08 LAB — ANA: Anti Nuclear Antibody (ANA): POSITIVE — AB

## 2022-05-08 LAB — CARDIOLIPIN ANTIBODIES, IGG, IGM, IGA
Anticardiolipin IgA: <2 APL-U/mL (ref <20.0)
Anticardiolipin IgG: <2 GPL-U/mL (ref <20.0)
Anticardiolipin IgM: <2 MPL-U/mL (ref <20.0)

## 2022-05-08 LAB — LUPUS ANTICOAGULANT EVAL W/ REFLEX
PTT-LA Screen: 30 s (ref <=40)
dRVVT: 44 s (ref <=45)

## 2022-05-08 LAB — BETA-2 GLYCOPROTEIN ANTIBODIES
Beta-2 Glyco 1 IgA: <2 U/mL (ref <20.0)
Beta-2 Glyco 1 IgM: <2 U/mL (ref <20.0)
Beta-2 Glyco I IgG: <2 U/mL (ref <20.0)

## 2022-05-08 LAB — ANTI-DNA ANTIBODY, DOUBLE-STRANDED: ds DNA Ab: 2 IU/mL

## 2022-05-08 LAB — SJOGRENS SYNDROME-B EXTRACTABLE NUCLEAR ANTIBODY: SSB (La) (ENA) Antibody, IgG: <1 AI

## 2022-05-08 LAB — RNP ANTIBODY: Ribonucleic Protein(ENA) Antibody, IgG: <1 AI

## 2022-05-08 LAB — ANTI-SMITH ANTIBODY: ENA SM Ab Ser-aCnc: <1 AI

## 2022-05-08 LAB — SJOGRENS SYNDROME-A EXTRACTABLE NUCLEAR ANTIBODY: SSA (Ro) (ENA) Antibody, IgG: <1 AI

## 2022-05-08 LAB — CK: Total CK: 58 U/L (ref 29–143)

## 2022-05-08 LAB — CYCLIC CITRUL PEPTIDE ANTIBODY, IGG: Cyclic Citrullin Peptide Ab: <16 UNITS

## 2022-05-10 NOTE — Progress Notes (Signed)
Office Visit Note  Patient: Nancy Page             Date of Birth: September 19, 2001           MRN: [id number]             PCP: Arnette Felts, FNP Referring: Arnette Felts, FNP Visit Date: 05/19/2022 Occupation: @GUAROCC @  Subjective:  Positive ANA  History of Present Illness: Nancy Page is a 21 y.o. female returns today for follow-up on positive ANA.  She is now [redacted] weeks pregnant.  She denies any history of oral ulcers, sicca symptoms, Raynaud's phenomenon, lymphadenopathy or photosensitivity.  There is no history of joint pain or joint swelling.  She has noticed mild redness on her face which persists.  She continues to have some fatigue.  Activities of Daily Living:  Patient reports morning stiffness for 0  minute .   Patient Denies nocturnal pain.  Difficulty dressing/grooming: Denies Difficulty climbing stairs: Denies Difficulty getting out of chair: Denies Difficulty using hands for taps, buttons, cutlery, and/or writing: Denies  Review of Systems  Constitutional:  Positive for fatigue.  HENT:  Negative for mouth dryness.   Eyes:  Negative for dryness.  Respiratory:  Negative for shortness of breath.   Cardiovascular:  Negative for swelling in legs/feet.  Gastrointestinal:  Positive for constipation and diarrhea.  Endocrine: Positive for excessive thirst.  Genitourinary:  Negative for difficulty urinating.  Musculoskeletal:  Negative for joint swelling, muscle weakness, morning stiffness and muscle tenderness.  Skin:  Positive for rash. Negative for color change and sensitivity to sunlight.  Allergic/Immunologic: Positive for susceptible to infections.  Neurological:  Positive for numbness.  Hematological:  Positive for bruising/bleeding tendency. Negative for swollen glands.  Psychiatric/Behavioral:  Positive for sleep disturbance. The patient is nervous/anxious.     PMFS History:  There are no problems to display for this patient.   History reviewed. No pertinent  past medical history.  Family History  Problem Relation Age of Onset   Hypertension Mother    Lupus Father    Heart murmur Brother    Diabetes Maternal Grandmother    Cancer Paternal Grandmother    History reviewed. No pertinent surgical history. Social History   Social History Narrative   Not on file    There is no immunization history on file for this patient.   Objective: Vital Signs: BP 102/66 (BP Location: Left Arm, Patient Position: Sitting, Cuff Size: Normal)   Pulse (!) 120   Resp 14   Ht 5\' 1"  (1.549 m)   Wt 125 lb (56.7 kg)   LMP 01/27/2022   BMI 23.62 kg/m    Physical Exam Vitals and nursing note reviewed.  Constitutional:      Appearance: She is well-developed.  HENT:     Head: Normocephalic and atraumatic.  Eyes:     Conjunctiva/sclera: Conjunctivae normal.  Cardiovascular:     Rate and Rhythm: Normal rate and regular rhythm.     Heart sounds: Normal heart sounds.  Pulmonary:     Effort: Pulmonary effort is normal.     Breath sounds: Normal breath sounds.  Abdominal:     General: Bowel sounds are normal.     Palpations: Abdomen is soft.     Comments: Fundal height 16 weeks  Musculoskeletal:     Cervical back: Normal range of motion.  Lymphadenopathy:     Cervical: No cervical adenopathy.  Skin:    General: Skin is warm and dry.  Capillary Refill: Capillary refill takes less than 2 seconds.     Comments: Mild erythema was noted over bilateral cheeks with dry skin.  Neurological:     Mental Status: She is alert and oriented to person, place, and time.  Psychiatric:        Behavior: Behavior normal.      Musculoskeletal Exam: C-spine was in good range of motion.  Shoulder joints, elbow joints, wrist joints, MCPs PIPs and DIPs with good range of motion with no synovitis.  Hip joints, knee joints, ankles, MTPs and PIPs with good range of motion with no synovitis.  CDAI Exam: CDAI Score: -- Patient Global: --; Provider Global: -- Swollen: 0  ; Tender: 0  Joint Exam 05/19/2022   No joint exam has been documented for this visit   There is currently no information documented on the homunculus. Go to the Rheumatology activity and complete the homunculus joint exam.  Investigation: No additional findings.  Imaging: No results found.  Recent Labs: Lab Results  Component Value Date   WBC 7.8 10/24/2021   HGB 13.6 10/24/2021   PLT 358 10/24/2021   NA 139 04/29/2022   K 4.0 04/29/2022   CL 106 04/29/2022   CO2 23 04/29/2022   GLUCOSE 68 04/29/2022   BUN 9 04/29/2022   CREATININE 0.61 04/29/2022   BILITOT 0.2 04/29/2022   AST 14 04/29/2022   ALT 13 04/29/2022   PROT 6.9 04/29/2022   CALCIUM 9.5 04/29/2022    Apr 29, 2022 ANA 1: 80NH, 1: 160NS, ENA negative, anticardiolipin negative, beta-2 GP 1 negative, lupus anticoagulant negative, C3-C4 normal, anti-CCP negative, CK 58  Speciality Comments: No specialty comments available.  Procedures:  No procedures performed Allergies: Cephalexin, Milk-related compounds, Wheat bran, and Other   Assessment / Plan:     Visit Diagnoses: Positive ANA (antinuclear antibody) - ANA low titer positive, ENA negative, aCL, beta-2 GP 1, lupus anticoagulant negative.  History of facial rash and fatigue.  History of miscarriages x2.  All autoimmune labs were negative except for low titer positive ANA.  Lab findings were discussed with the patient.  She gives history of fatigue and facial rash.  I advised her to contact me if she develops any symptoms.  We will recheck labs in 6 months prior to her next appointment.  Myalgia -according the patient the myalgias resolved. CK normal.  Other fatigue-she continues to have some fatigue.  Which could be related to pregnancy.  Chronic midline low back pain without sciatica - Since she has been pregnant.  Most likely muscular in origin.  [redacted] weeks gestation of pregnancy  Vitamin D deficiency - January 29, 2022 vitamin D 11.8  Brain  fog  Seasonal allergies  Orders: Orders Placed This Encounter  Procedures   CBC with Differential/Platelet   COMPLETE METABOLIC PANEL WITH GFR   ANA   Anti-DNA antibody, double-stranded   C3 and C4   Sedimentation rate   No orders of the defined types were placed in this encounter.    Follow-Up Instructions: Return in about 6 months (around 11/18/2022) for fatigue, +ANA.   Pollyann Savoy, MD  Note - This record has been created using Animal nutritionist.  Chart creation errors have been sought, but may not always  have been located. Such creation errors do not reflect on  the standard of medical care.

## 2022-05-10 NOTE — Progress Notes (Signed)
I will discuss results at the follow-up visit.

## 2022-05-14 DIAGNOSIS — Z369 Encounter for antenatal screening, unspecified: Secondary | ICD-10-CM | POA: Diagnosis not present

## 2022-05-14 DIAGNOSIS — Z315 Encounter for genetic counseling: Secondary | ICD-10-CM | POA: Diagnosis not present

## 2022-05-14 DIAGNOSIS — Z3481 Encounter for supervision of other normal pregnancy, first trimester: Secondary | ICD-10-CM | POA: Diagnosis not present

## 2022-05-14 DIAGNOSIS — R768 Other specified abnormal immunological findings in serum: Secondary | ICD-10-CM | POA: Diagnosis not present

## 2022-05-14 DIAGNOSIS — Z363 Encounter for antenatal screening for malformations: Secondary | ICD-10-CM | POA: Diagnosis not present

## 2022-05-14 LAB — OB RESULTS CONSOLE HEPATITIS B SURFACE ANTIGEN: Hepatitis B Surface Ag: NEGATIVE

## 2022-05-14 LAB — OB RESULTS CONSOLE RUBELLA ANTIBODY, IGM: Rubella: IMMUNE

## 2022-05-14 LAB — OB RESULTS CONSOLE HIV ANTIBODY (ROUTINE TESTING): HIV: NONREACTIVE

## 2022-05-18 ENCOUNTER — Encounter: Payer: Self-pay | Admitting: *Deleted

## 2022-05-18 ENCOUNTER — Other Ambulatory Visit: Payer: Self-pay | Admitting: Obstetrics & Gynecology

## 2022-05-18 DIAGNOSIS — Z363 Encounter for antenatal screening for malformations: Secondary | ICD-10-CM

## 2022-05-19 ENCOUNTER — Ambulatory Visit: Payer: Self-pay

## 2022-05-19 ENCOUNTER — Ambulatory Visit (INDEPENDENT_AMBULATORY_CARE_PROVIDER_SITE_OTHER): Payer: BC Managed Care – PPO | Admitting: Rheumatology

## 2022-05-19 ENCOUNTER — Encounter: Payer: Self-pay | Admitting: Rheumatology

## 2022-05-19 VITALS — BP 102/66 | HR 120 | Resp 14 | Ht 61.0 in | Wt 125.0 lb

## 2022-05-19 DIAGNOSIS — R768 Other specified abnormal immunological findings in serum: Secondary | ICD-10-CM | POA: Diagnosis not present

## 2022-05-19 DIAGNOSIS — M791 Myalgia, unspecified site: Secondary | ICD-10-CM

## 2022-05-19 DIAGNOSIS — R4189 Other symptoms and signs involving cognitive functions and awareness: Secondary | ICD-10-CM

## 2022-05-19 DIAGNOSIS — G8929 Other chronic pain: Secondary | ICD-10-CM

## 2022-05-19 DIAGNOSIS — Z3A13 13 weeks gestation of pregnancy: Secondary | ICD-10-CM

## 2022-05-19 DIAGNOSIS — M545 Low back pain, unspecified: Secondary | ICD-10-CM | POA: Diagnosis not present

## 2022-05-19 DIAGNOSIS — R5383 Other fatigue: Secondary | ICD-10-CM | POA: Diagnosis not present

## 2022-05-19 DIAGNOSIS — J302 Other seasonal allergic rhinitis: Secondary | ICD-10-CM

## 2022-05-19 DIAGNOSIS — Z3A16 16 weeks gestation of pregnancy: Secondary | ICD-10-CM

## 2022-05-19 DIAGNOSIS — E559 Vitamin D deficiency, unspecified: Secondary | ICD-10-CM

## 2022-05-19 NOTE — Patient Instructions (Signed)
Please get labs 2 weeks prior to your next appointment. 

## 2022-05-21 ENCOUNTER — Ambulatory Visit: Payer: BC Managed Care – PPO | Attending: Obstetrics & Gynecology

## 2022-05-21 ENCOUNTER — Ambulatory Visit: Payer: BC Managed Care – PPO

## 2022-05-21 ENCOUNTER — Ambulatory Visit (HOSPITAL_BASED_OUTPATIENT_CLINIC_OR_DEPARTMENT_OTHER): Payer: BC Managed Care – PPO | Admitting: Maternal & Fetal Medicine

## 2022-05-21 ENCOUNTER — Ambulatory Visit: Payer: BC Managed Care – PPO | Admitting: *Deleted

## 2022-05-21 ENCOUNTER — Other Ambulatory Visit: Payer: Self-pay | Admitting: *Deleted

## 2022-05-21 ENCOUNTER — Encounter: Payer: Self-pay | Admitting: *Deleted

## 2022-05-21 VITALS — BP 107/61 | HR 105

## 2022-05-21 DIAGNOSIS — Z363 Encounter for antenatal screening for malformations: Secondary | ICD-10-CM | POA: Diagnosis not present

## 2022-05-21 DIAGNOSIS — O26892 Other specified pregnancy related conditions, second trimester: Secondary | ICD-10-CM | POA: Diagnosis not present

## 2022-05-21 DIAGNOSIS — I471 Supraventricular tachycardia: Secondary | ICD-10-CM

## 2022-05-21 DIAGNOSIS — Z3A16 16 weeks gestation of pregnancy: Secondary | ICD-10-CM

## 2022-05-21 DIAGNOSIS — R768 Other specified abnormal immunological findings in serum: Secondary | ICD-10-CM

## 2022-05-22 NOTE — Progress Notes (Incomplete)
MFM Brief Note  Nancy Page is a 21 yo G 1P0 who is seen at [redacted]w[redacted]d with an EDD of 11/02/22.  She is seen today at the request of Essie Hart, MD regarding autoimmune symptoms of unknown etiology.

## 2022-06-12 DIAGNOSIS — Z363 Encounter for antenatal screening for malformations: Secondary | ICD-10-CM | POA: Diagnosis not present

## 2022-06-12 DIAGNOSIS — Z3A19 19 weeks gestation of pregnancy: Secondary | ICD-10-CM | POA: Diagnosis not present

## 2022-06-18 ENCOUNTER — Other Ambulatory Visit: Payer: Self-pay | Admitting: *Deleted

## 2022-06-18 ENCOUNTER — Other Ambulatory Visit: Payer: Self-pay

## 2022-06-18 ENCOUNTER — Ambulatory Visit: Payer: BC Managed Care – PPO | Admitting: *Deleted

## 2022-06-18 ENCOUNTER — Ambulatory Visit: Payer: BC Managed Care – PPO | Attending: Maternal & Fetal Medicine

## 2022-06-18 ENCOUNTER — Encounter: Payer: Self-pay | Admitting: *Deleted

## 2022-06-18 VITALS — BP 120/62 | HR 103

## 2022-06-18 DIAGNOSIS — O280 Abnormal hematological finding on antenatal screening of mother: Secondary | ICD-10-CM

## 2022-06-18 DIAGNOSIS — R768 Other specified abnormal immunological findings in serum: Secondary | ICD-10-CM | POA: Insufficient documentation

## 2022-06-18 DIAGNOSIS — O35EXX0 Maternal care for other (suspected) fetal abnormality and damage, fetal genitourinary anomalies, not applicable or unspecified: Secondary | ICD-10-CM

## 2022-06-18 DIAGNOSIS — Z362 Encounter for other antenatal screening follow-up: Secondary | ICD-10-CM

## 2022-06-18 DIAGNOSIS — Z8679 Personal history of other diseases of the circulatory system: Secondary | ICD-10-CM

## 2022-06-18 DIAGNOSIS — Z3A20 20 weeks gestation of pregnancy: Secondary | ICD-10-CM

## 2022-06-18 DIAGNOSIS — O09892 Supervision of other high risk pregnancies, second trimester: Secondary | ICD-10-CM | POA: Diagnosis not present

## 2022-06-24 ENCOUNTER — Ambulatory Visit: Payer: BC Managed Care – PPO | Admitting: Cardiovascular Disease

## 2022-06-24 NOTE — Progress Notes (Deleted)
NO SHIOW

## 2022-06-25 ENCOUNTER — Encounter: Payer: Self-pay | Admitting: Cardiovascular Disease

## 2022-07-13 NOTE — Progress Notes (Deleted)
Office Visit Note  Patient: Nancy Page             Date of Birth: 2001-11-14           MRN: [id number]             PCP: Arnette Felts, FNP Referring: Arnette Felts, FNP Visit Date: 07/23/2022 Occupation: @GUAROCC @  Subjective:  No chief complaint on file.   History of Present Illness: Nancy Page is a 21 y.o. female ***   Activities of Daily Living:  Patient reports morning stiffness for *** {minute/hour:19697}.   Patient {ACTIONS;DENIES/REPORTS:21021675::"Denies"} nocturnal pain.  Difficulty dressing/grooming: {ACTIONS;DENIES/REPORTS:21021675::"Denies"} Difficulty climbing stairs: {ACTIONS;DENIES/REPORTS:21021675::"Denies"} Difficulty getting out of chair: {ACTIONS;DENIES/REPORTS:21021675::"Denies"} Difficulty using hands for taps, buttons, cutlery, and/or writing: {ACTIONS;DENIES/REPORTS:21021675::"Denies"}  No Rheumatology ROS completed.   PMFS History:  There are no problems to display for this patient.   Past Medical History:  Diagnosis Date   ANA positive    SVT (supraventricular tachycardia) (HCC)    Vitamin D deficiency     Family History  Problem Relation Age of Onset   Hypertension Mother    Lupus Father    Heart murmur Brother    Diabetes Maternal Grandmother    Cancer Paternal Grandmother    Past Surgical History:  Procedure Laterality Date   NO PAST SURGERIES     Social History   Social History Narrative   Not on file    There is no immunization history on file for this patient.   Objective: Vital Signs: LMP 01/27/2022    Physical Exam   Musculoskeletal Exam: ***  CDAI Exam: CDAI Score: -- Patient Global: --; Provider Global: -- Swollen: --; Tender: -- Joint Exam 07/23/2022   No joint exam has been documented for this visit   There is currently no information documented on the homunculus. Go to the Rheumatology activity and complete the homunculus joint exam.  Investigation: No additional findings.  Imaging: 07/25/2022 MFM OB  DETAIL +14 WK  Result Date: 06/18/2022 ----------------------------------------------------------------------  OBSTETRICS REPORT                       (Signed Final 06/18/2022 11:36 am) ---------------------------------------------------------------------- Patient Info  ID #:       06/20/2022                          D.O.B.:  [birth year] (21 yrs)  Name:       Nancy Page                 Visit Date: 06/18/2022 09:34 am ---------------------------------------------------------------------- Performed By  Attending:        06/20/2022 MD        Ref. Address:     [facility]  [address]                                                             [place], [place]  Performed By:     Nevin Bloodgood          [facility]                                                             [place] [facility]                                                             [facility]  Referred By:      Waymon Amato MD ---------------------------------------------------------------------- Orders  #  Description                           Code        Ordered By  1  [place] MFM OB DETAIL +14 WK               [id number]    Sander Nephew ----------------------------------------------------------------------  #  Order #                     Accession #                Episode #  [id number]  [id number]  [id number]                 [id number] ---------------------------------------------------------------------- Indications  Encounter for antenatal screening for          Z36.3  malformations  Positive ANA  Medical complication of pregnancy (hx  SVT)     O26.90  [redacted] weeks gestation of pregnancy                Z3A.20 ---------------------------------------------------------------------- Fetal Evaluation  Num Of Fetuses:         1  Fetal Heart Rate(bpm):  158  Cardiac Activity:       Observed  Presentation:           Variable  Placenta:               Posterior  P. Cord Insertion:      Visualized, central  Amniotic Fluid  AFI FV:      Within normal limits                              Largest Pocket(cm)                              4.98 ---------------------------------------------------------------------- Biometry  BPD:     48.47  mm     G. Age:  20w 5d         65  %    CI:        76.01   %    70 - 86                                                          FL/HC:      19.5   %    16.8 - 19.8  HC:    176.22   mm     G. Age:  20w 1d         34  %    HC/AC:      1.15        1.09 - 1.39  AC:    153.43   mm     G. Age:  20w 4d         53  %    FL/BPD:     70.9   %  FL:      34.37  mm     G. Age:  20w 6d         61  %    FL/AC:      22.4   %    20 - 24  CER:      22.1  mm     G. Age:  20w 5d         84  %  LV:          6  mm  CM:          3  mm  Est. FW:     365  gm    0 lb 13 oz      64  % ---------------------------------------------------------------------- OB History  Gravidity:    1  Living:       0 ---------------------------------------------------------------------- Gestational Age  LMP:  20w 2d        Date:  01/27/22                  EDD:   11/03/22  U/S Today:     20w 4d                                        EDD:   11/01/22  Best:          Nancy Page 2d     Det. By:  LMP  (01/27/22)          EDD:   11/03/22 ---------------------------------------------------------------------- Anatomy  Cranium:               Appears normal         LVOT:                   Appears normal  Cavum:                 Appears normal         Aortic Arch:            Appears normal  Ventricles:            Appears normal         Ductal Arch:            Appears normal  Choroid Plexus:         Appears normal         Diaphragm:              Appears normal  Cerebellum:            Appears normal         Stomach:                Appears normal, left                                                                        sided  Posterior Fossa:       Appears normal         Abdomen:                Appears normal  Nuchal Fold:           Appears normal         Abdominal Wall:         Appears nml (cord                                                                        insert, abd wall)  Face:                  Appears normal         Cord Vessels:           Appears normal (3                         (  orbits and profile)                           vessel cord)  Lips:                  Appears normal         Kidneys:                Rt UTD 6 mm  Palate:                Not well visualized    Bladder:                Appears normal  Thoracic:              Appears normal         Spine:                  Appears normal  Heart:                 Appears normal         Upper Extremities:      Appears normal                         (4CH, axis, and                         situs)  RVOT:                  Appears normal         Lower Extremities:      Appears normal  Other:  Open hands previously visualized. Heels and feet visualized. VC, 3VV          and 3VTV visualized. Fetus appears to be a female. Bilateral          pyelectasis Rt 5.80mm Lt 3.58mm. ---------------------------------------------------------------------- Cervix Uterus Adnexa  Cervix  Length:              3  cm.  Normal appearance by transabdominal scan.  Uterus  No abnormality visualized.  Right Ovary  Within normal limits.  Left Ovary  Within normal limits.  Cul De Sac  No free fluid seen.  Adnexa  No abnormality visualized. ---------------------------------------------------------------------- Impression  Patient return for detailed fetal anatomy.  She had  rheumatology consultation for increased ANA.  No  rheumatological conditions were seen.  [facility] had  [facility] at her last visit.  On cell-free fetal DNA screening, the risk of fetal aneuploidies  are not increased.  We performed a fetal anatomy scan. Right urinary tract  dilation (UTD) is seen measuring 6 millimeters.  Left kidney  and fetal bladder appear normal.  No other markers of aneuploidies or fetal structural defects  are seen. Fetal biometry is consistent with her previously-  established dates. Amniotic fluid is normal and good fetal  activity is seen. Patient understands the limitations of  ultrasound in detecting fetal anomalies.  I reassured the patient that given her cell free fetal DNA  screening showed low risk for Down syndrome, this should be  considered a normal variant.  Most UTDs resolve with  advancing gestation and are only rarely associated with  obstructive uropathy. ---------------------------------------------------------------------- Recommendations  -An appointment was made for her to return in 8 weeks for  fetal growth assessment. ----------------------------------------------------------------------  Tama High, MD Electronically Signed Final Report   06/18/2022 11:36 am ----------------------------------------------------------------------   Recent Labs: Lab Results  Component Value Date   WBC 7.8 10/24/2021   HGB 13.6 10/24/2021   PLT 358 10/24/2021   NA 139 04/29/2022   K 4.0 04/29/2022   CL 106 04/29/2022   CO2 23 04/29/2022   GLUCOSE 68 04/29/2022   BUN 9 04/29/2022   CREATININE 0.61 04/29/2022   BILITOT 0.2 04/29/2022   AST 14 04/29/2022   ALT 13 04/29/2022   PROT 6.9 04/29/2022   CALCIUM 9.5 04/29/2022    Speciality Comments: No specialty comments available.  Procedures:  No procedures performed Allergies: Cephalexin, Milk-related compounds, Wheat bran, and Other   Assessment / Plan:     Visit Diagnoses: No diagnosis found.  Orders: No orders of the defined types were placed in this  encounter.  No orders of the defined types were placed in this encounter.   Face-to-face time spent with patient was *** minutes. Greater than 50% of time was spent in counseling and coordination of care.  Follow-Up Instructions: No follow-ups on file.   Earnestine Mealing, CMA  Note - This record has been created using Editor, commissioning.  Chart creation errors have been sought, but may not always  have been located. Such creation errors do not reflect on  the standard of medical care.

## 2022-07-14 ENCOUNTER — Ambulatory Visit: Payer: BC Managed Care – PPO | Admitting: Rheumatology

## 2022-07-23 ENCOUNTER — Ambulatory Visit: Payer: BC Managed Care – PPO | Admitting: Physician Assistant

## 2022-07-23 ENCOUNTER — Ambulatory Visit: Payer: BC Managed Care – PPO | Attending: Physician Assistant | Admitting: Rheumatology

## 2022-07-23 DIAGNOSIS — M791 Myalgia, unspecified site: Secondary | ICD-10-CM

## 2022-07-23 DIAGNOSIS — R5383 Other fatigue: Secondary | ICD-10-CM

## 2022-07-23 DIAGNOSIS — M545 Low back pain, unspecified: Secondary | ICD-10-CM

## 2022-07-23 DIAGNOSIS — E559 Vitamin D deficiency, unspecified: Secondary | ICD-10-CM

## 2022-07-23 DIAGNOSIS — J302 Other seasonal allergic rhinitis: Secondary | ICD-10-CM

## 2022-07-23 DIAGNOSIS — Z3A16 16 weeks gestation of pregnancy: Secondary | ICD-10-CM

## 2022-07-23 DIAGNOSIS — R4189 Other symptoms and signs involving cognitive functions and awareness: Secondary | ICD-10-CM

## 2022-07-23 DIAGNOSIS — R768 Other specified abnormal immunological findings in serum: Secondary | ICD-10-CM

## 2022-08-07 DIAGNOSIS — Z362 Encounter for other antenatal screening follow-up: Secondary | ICD-10-CM | POA: Diagnosis not present

## 2022-08-12 ENCOUNTER — Ambulatory Visit: Payer: BC Managed Care – PPO | Admitting: Rheumatology

## 2022-08-13 ENCOUNTER — Ambulatory Visit: Payer: BC Managed Care – PPO | Admitting: *Deleted

## 2022-08-13 ENCOUNTER — Other Ambulatory Visit: Payer: Self-pay | Admitting: Obstetrics and Gynecology

## 2022-08-13 ENCOUNTER — Ambulatory Visit: Payer: BC Managed Care – PPO | Attending: Obstetrics and Gynecology

## 2022-08-13 VITALS — BP 117/75 | HR 93

## 2022-08-13 DIAGNOSIS — O35EXX0 Maternal care for other (suspected) fetal abnormality and damage, fetal genitourinary anomalies, not applicable or unspecified: Secondary | ICD-10-CM

## 2022-08-13 DIAGNOSIS — R768 Other specified abnormal immunological findings in serum: Secondary | ICD-10-CM

## 2022-08-13 DIAGNOSIS — Z3A28 28 weeks gestation of pregnancy: Secondary | ICD-10-CM | POA: Diagnosis not present

## 2022-08-26 ENCOUNTER — Encounter: Payer: BC Managed Care – PPO | Admitting: Nurse Practitioner

## 2022-09-03 ENCOUNTER — Emergency Department (HOSPITAL_BASED_OUTPATIENT_CLINIC_OR_DEPARTMENT_OTHER)
Admit: 2022-09-03 | Discharge: 2022-09-03 | Disposition: A | Discharge status: Home / Self Care | Payer: BC Managed Care – PPO

## 2022-09-03 ENCOUNTER — Emergency Department (HOSPITAL_COMMUNITY)
Admission: EM | Admit: 2022-09-03 | Discharge: 2022-09-03 | Discharge status: Against Medical Advice | Payer: BC Managed Care – PPO

## 2022-09-03 ENCOUNTER — Emergency Department (HOSPITAL_COMMUNITY)
Admission: EM | Admit: 2022-09-03 | Discharge: 2022-09-03 | Disposition: A | Discharge status: Home / Self Care | Payer: BC Managed Care – PPO | Attending: Emergency Medicine | Admitting: Emergency Medicine

## 2022-09-03 ENCOUNTER — Encounter (HOSPITAL_COMMUNITY): Payer: Self-pay

## 2022-09-03 DIAGNOSIS — M7989 Other specified soft tissue disorders: Secondary | ICD-10-CM | POA: Insufficient documentation

## 2022-09-03 DIAGNOSIS — R52 Pain, unspecified: Secondary | ICD-10-CM | POA: Diagnosis not present

## 2022-09-03 DIAGNOSIS — R252 Cramp and spasm: Secondary | ICD-10-CM | POA: Diagnosis not present

## 2022-09-03 DIAGNOSIS — Z3A31 31 weeks gestation of pregnancy: Secondary | ICD-10-CM | POA: Diagnosis not present

## 2022-09-03 DIAGNOSIS — O26893 Other specified pregnancy related conditions, third trimester: Secondary | ICD-10-CM | POA: Insufficient documentation

## 2022-09-03 LAB — URINALYSIS, ROUTINE W REFLEX MICROSCOPIC
Bacteria, UA: NONE SEEN
Bilirubin Urine: NEGATIVE
Glucose, UA: NEGATIVE mg/dL
Hgb urine dipstick: NEGATIVE
Ketones, ur: NEGATIVE mg/dL
Nitrite: NEGATIVE
Protein, ur: NEGATIVE mg/dL
Specific Gravity, Urine: 1.012 (ref 1.005–1.030)
pH: 7 (ref 5.0–8.0)

## 2022-09-03 LAB — BASIC METABOLIC PANEL WITH GFR
Anion gap: 6 (ref 5–15)
BUN: 7 mg/dL (ref 6–20)
CO2: 22 mmol/L (ref 22–32)
Calcium: 9.6 mg/dL (ref 8.9–10.3)
Chloride: 107 mmol/L (ref 98–111)
Creatinine, Ser: 0.64 mg/dL (ref 0.44–1.00)
GFR, Estimated: >60 mL/min
Glucose, Bld: 86 mg/dL (ref 70–99)
Potassium: 4.1 mmol/L (ref 3.5–5.1)
Sodium: 135 mmol/L (ref 135–145)

## 2022-09-03 LAB — CBC WITH DIFFERENTIAL/PLATELET
Abs Immature Granulocytes: 0.07 10*3/uL (ref 0.00–0.07)
Basophils Absolute: 0 10*3/uL (ref 0.0–0.1)
Basophils Relative: 0 %
Eosinophils Absolute: 0 10*3/uL (ref 0.0–0.5)
Eosinophils Relative: 0 %
HCT: 34.5 % — ABNORMAL LOW (ref 36.0–46.0)
Hemoglobin: 11.2 g/dL — ABNORMAL LOW (ref 12.0–15.0)
Immature Granulocytes: 1 %
Lymphocytes Relative: 18 %
Lymphs Abs: 1.7 10*3/uL (ref 0.7–4.0)
MCH: 28.8 pg (ref 26.0–34.0)
MCHC: 32.5 g/dL (ref 30.0–36.0)
MCV: 88.7 fL (ref 80.0–100.0)
Monocytes Absolute: 0.8 10*3/uL (ref 0.1–1.0)
Monocytes Relative: 9 %
Neutro Abs: 6.4 10*3/uL (ref 1.7–7.7)
Neutrophils Relative %: 72 %
Platelets: 291 10*3/uL (ref 150–400)
RBC: 3.89 MIL/uL (ref 3.87–5.11)
RDW: 12.6 % (ref 11.5–15.5)
WBC: 9 10*3/uL (ref 4.0–10.5)
nRBC: 0 % (ref 0.0–0.2)

## 2022-09-03 NOTE — ED Triage Notes (Signed)
Pt states she began having bilateral calf pain for two days. Pt reports she is [redacted] weeks pregnant. G3P0. PMS intact during triage.

## 2022-09-03 NOTE — Progress Notes (Signed)
Right lower extremity venous duplex has been completed. Preliminary results can be found in CV Proc through chart review.  Results were given to Margarita Mail PA.  09/03/22 2:17 PM Carlos Levering RVT

## 2022-09-03 NOTE — Discharge Instructions (Addendum)
Contact a health care provider if: Your leg cramps get more severe or more frequent, or they do not improve over time. Your foot becomes cold, numb, or blue. 

## 2022-09-03 NOTE — ED Provider Notes (Signed)
[facility]-EMERGENCY DEPT Provider Note   CSN: [id number] Arrival date & time: 09/03/22  1215     History  Chief Complaint  Patient presents with   Leg Pain    Nancy Page is a 21 y.o. female who approximately [redacted] weeks pregnant who presents emergency department for calf cramps.  Patient states that she has been getting charley horses in her calf.  When she stretches in the morning she gets severe pain in her leg makes her jump out of bed.  She states that she has never had these before.  She continues to have some tenderness in her calves.  She denies chest pain or shortness of breath.  She denies abdominal pain, cramping, fluid or discharge from her vagina.  She has been getting prenatal care at [facility].   Leg Pain      Home Medications Prior to Admission medications   Medication Sig Start Date End Date Taking? Authorizing Provider  albuterol (VENTOLIN HFA) 108 (90 Base) MCG/ACT inhaler Inhale 2 puffs into the lungs every 6 (six) hours as needed. Patient not taking: Reported on 04/29/2022 04/09/21   [provider]  Prenatal Vit-Fe Fumarate-FA (PRENATAL VITAMIN PO) Take by mouth.    [provider]  fluticasone (FLONASE) 50 MCG/ACT nasal spray Place 2 sprays into both nostrils daily. 10/03/17 03/18/21  Belinda Fisher, PA-C  levonorgestrel-ethinyl estradiol (SEASONALE,INTROVALE,JOLESSA) 0.15-0.03 MG tablet Take 1 tablet by mouth daily.  03/18/21  [provider]      Allergies    Cephalexin, Milk-related compounds, Wheat bran, and Other    Review of Systems   Review of Systems  Physical Exam Updated Vital Signs BP 134/80 (BP Location: Left Arm)   Pulse 99   Temp 98.4 F (36.9 C) (Oral)   Resp 18   LMP 01/27/2022   SpO2 99%  Physical Exam Vitals and nursing note reviewed.  Constitutional:      General: She is not in acute distress.    Appearance: She is well-developed. She is not diaphoretic.  HENT:      Head: Normocephalic and atraumatic.     Right Ear: External ear normal.     Left Ear: External ear normal.     Nose: Nose normal.     Mouth/Throat:     Mouth: Mucous membranes are moist.  Eyes:     General: No scleral icterus.    Conjunctiva/sclera: Conjunctivae normal.  Cardiovascular:     Rate and Rhythm: Normal rate and regular rhythm.     Heart sounds: Normal heart sounds. No murmur heard.    No friction rub. No gallop.  Pulmonary:     Effort: Pulmonary effort is normal. No respiratory distress.     Breath sounds: Normal breath sounds.  Abdominal:     General: Bowel sounds are normal. There is no distension.     Palpations: Abdomen is soft. There is no mass.     Tenderness: There is no abdominal tenderness. There is no guarding.     Comments: Gravid abdomen No heart tones 158  Musculoskeletal:     Cervical back: Normal range of motion.  Skin:    General: Skin is warm and dry.  Neurological:     Mental Status: She is alert and oriented to person, place, and time.  Psychiatric:        Behavior: Behavior normal.     ED Results / Procedures / Treatments   Labs (all labs ordered are listed, but  only abnormal results are displayed) Labs Reviewed  CBC WITH DIFFERENTIAL/PLATELET - Abnormal; Notable for the following components:      Result Value   Hemoglobin 11.2 (*)    HCT 34.5 (*)    All other components within normal limits  URINALYSIS, ROUTINE W REFLEX MICROSCOPIC - Abnormal; Notable for the following components:   APPearance HAZY (*)    Leukocytes,Ua LARGE (*)    All other components within normal limits  BASIC METABOLIC PANEL    EKG None  Radiology VAS [place] LOWER EXTREMITY VENOUS (DVT)  Result Date: 09/03/2022  Lower Venous DVT Study Patient Name:  Nancy Page  Date of Exam:   09/03/2022 Medical Rec #: [id number]        Accession #:    [id number] Date of Birth: [birth year]         Patient Gender: F Patient Age:   49 years Exam Location:  [facility]  Procedure:      VAS [place] LOWER EXTREMITY VENOUS (DVT) Referring Phys: Keierra Nudo --------------------------------------------------------------------------------  Indications: Pain.  Risk Factors: Current pregnancy. Comparison Study: No prior studies. Performing Technologist: Oliver Hum RVT  Examination Guidelines: A complete evaluation includes B-mode imaging, spectral Doppler, color Doppler, and power Doppler as needed of all accessible portions of each vessel. Bilateral testing is considered an integral part of a complete examination. Limited examinations for reoccurring indications may be performed as noted. The reflux portion of the exam is performed with the patient in reverse Trendelenburg.  +---------+---------------+---------+-----------+----------+--------------+ RIGHT    CompressibilityPhasicitySpontaneityPropertiesThrombus Aging +---------+---------------+---------+-----------+----------+--------------+ CFV      Full           Yes      Yes                                 +---------+---------------+---------+-----------+----------+--------------+ SFJ      Full                                                        +---------+---------------+---------+-----------+----------+--------------+ FV Prox  Full                                                        +---------+---------------+---------+-----------+----------+--------------+ FV Mid   Full                                                        +---------+---------------+---------+-----------+----------+--------------+ FV DistalFull                                                        +---------+---------------+---------+-----------+----------+--------------+ PFV      Full                                                        +---------+---------------+---------+-----------+----------+--------------+  POP      Full           Yes      Yes                                  +---------+---------------+---------+-----------+----------+--------------+ PTV      Full                                                        +---------+---------------+---------+-----------+----------+--------------+ PERO     Full                                                        +---------+---------------+---------+-----------+----------+--------------+   +----+---------------+---------+-----------+----------+--------------+ LEFTCompressibilityPhasicitySpontaneityPropertiesThrombus Aging +----+---------------+---------+-----------+----------+--------------+ CFV Full           Yes      Yes                                 +----+---------------+---------+-----------+----------+--------------+     Summary: RIGHT: - There is no evidence of deep vein thrombosis in the lower extremity.  - No cystic structure found in the popliteal fossa.  LEFT: - No evidence of common femoral vein obstruction.  *See table(s) above for measurements and observations.    Preliminary     Procedures Procedures    Medications Ordered in ED Medications - No data to display  ED Course/ Medical Decision Making/ A&P                           Medical Decision Making Patient here with calf cramping and pain.  Vascular ultrasound ordered and performed.  I visualized and interpreted these films.  She has no evidence of DVT. I ordered and reviewed patient's labs.  No significant electrolyte disturbances.  Mild anemia which is appropriate in the setting of late pregnancy.  She has large leukocytes however there is no evidence of bacteriuria and the patient has no vaginal complaints or urinary complaints.  I suspect patient is having cramps which is frequent in late pregnancy.  No emergent findings at this visit.  Refer back to nurse midwifery.  She appears otherwise appropriate for discharge.  Discussed return precautions.  Amount and/or Complexity of Data Reviewed Labs:  ordered.           Final Clinical Impression(s) / ED Diagnoses Final diagnoses:  None    Rx / DC Orders ED Discharge Orders     None         Arthor Captain, PA-C 09/03/22 1512    Melene Plan, DO 09/03/22 1612

## 2022-09-03 NOTE — ED Notes (Signed)
Pt. Was assigned to Triage one and pt is not in the room (triage one). Called to the lobby x 3. Called to triage at [facility] no pt. And to MAU not there.

## 2022-09-03 NOTE — ED Notes (Signed)
An After Visit Summary was printed and given to the patient. Discharge instructions given and no further questions at this time.  Pt leaving with family.  

## 2022-09-03 NOTE — ED Provider Triage Note (Signed)
Emergency Medicine Provider Triage Evaluation Note  Nancy Page , a 21 y.o. female  was evaluated in triage.  Pt complains of right lower extremity pain.  She is G3, P0.  She has been getting charley horses in her legs however she has had persistent pain in the right lower extremity for the past several days.  She comes for further evaluation, no other complaints.  Review of Systems  Positive: Tenderness in bilateral calves right greater than left Negative: Shortness of breath  Physical Exam  BP 134/80 (BP Location: Left Arm)   Pulse 99   Temp 98.4 F (36.9 C) (Oral)   Resp 18   LMP 01/27/2022   SpO2 99%  Gen:   Awake, no distress   Resp:  Normal effort  MSK:   Moves extremities without difficulty  Other:  Tenderness in the right lower extremity  Medical Decision Making  Medically screening exam initiated at 1:28 PM.  Appropriate orders placed.  LAMAE FOSCO was informed that the remainder of the evaluation will be completed by another provider, this initial triage assessment does not replace that evaluation, and the importance of remaining in the ED until their evaluation is complete.  Work-up initiated  158 fetal heart tones    Margarita Mail, PA-C 09/03/22 1331

## 2022-09-03 NOTE — ED Notes (Signed)
Fetal HR 158 bpm

## 2022-09-07 ENCOUNTER — Telehealth: Payer: Self-pay

## 2022-09-07 NOTE — Telephone Encounter (Signed)
Transition Care Management Unsuccessful Follow-up Telephone Call  Date of discharge and from where:  09/03/2022   Attempts:  1st Attempt  Reason for unsuccessful TCM follow-up call:  Left voice message

## 2022-09-08 ENCOUNTER — Telehealth: Payer: Self-pay

## 2022-09-08 NOTE — Telephone Encounter (Signed)
Transition Care Management Unsuccessful Follow-up Telephone Call  Date of discharge and from where:  09/03/2022  Attempts:  2nd Attempt  Reason for unsuccessful TCM follow-up call:  Unable to reach patient  Number belongs to mother. Spoke with mother and she states that she will have the pt call our office to schedule.

## 2022-09-21 DIAGNOSIS — O368399 Maternal care for abnormalities of the fetal heart rate or rhythm, unspecified trimester, other fetus: Secondary | ICD-10-CM | POA: Diagnosis not present

## 2022-09-22 ENCOUNTER — Other Ambulatory Visit: Payer: Self-pay

## 2022-09-22 ENCOUNTER — Inpatient Hospital Stay (HOSPITAL_COMMUNITY)
Admission: AD | Admit: 2022-09-22 | Discharge: 2022-09-22 | Disposition: A | Discharge status: Home / Self Care | Payer: BC Managed Care – PPO | Attending: Obstetrics and Gynecology | Admitting: Obstetrics and Gynecology

## 2022-09-22 ENCOUNTER — Encounter (HOSPITAL_COMMUNITY): Payer: Self-pay | Admitting: Obstetrics and Gynecology

## 2022-09-22 DIAGNOSIS — Z3A34 34 weeks gestation of pregnancy: Secondary | ICD-10-CM | POA: Diagnosis not present

## 2022-09-22 DIAGNOSIS — Z3689 Encounter for other specified antenatal screening: Secondary | ICD-10-CM | POA: Diagnosis not present

## 2022-09-22 DIAGNOSIS — O368130 Decreased fetal movements, third trimester, not applicable or unspecified: Secondary | ICD-10-CM | POA: Diagnosis not present

## 2022-09-22 NOTE — MAU Note (Signed)
Nancy Page is a 21 y.o. at [redacted]w[redacted]d here in MAU reporting: she's here for decreased FM, reports movement less than usual.  States was seen in office yesterday for routine visit and FHR was elevated during doppler, EFM applied for further evaluation.  Denies VB  and LOF.   LMP: N/A Onset of complaint: yesterday Pain score: 4 Vitals:   09/22/22 1112  BP: 121/68  Pulse: 95  Resp: 18  Temp: 98.2 F (36.8 C)  SpO2: 98%     [id number] d/t maternal attire Lab orders placed from triage:   None

## 2022-09-22 NOTE — MAU Provider Note (Signed)
History     CSN: [id number]  Arrival date and time: 09/22/22 1058   Event Date/Time   First Provider Initiated Contact with Patient 09/22/22 1131      Chief Complaint  Patient presents with   Decreased Fetal Movement   HPI Nancy Page is a 21 y.o. G1P0 at [redacted]w[redacted]d who presents to MAU with chief complaint of decreased fetal movement. This is a new problem, onset today.  Of note, patient has not yet eaten today.   Patient states she was in the office at [facility] yesterday. Her baby's heart rate was 180 by Doppler so she was placed on the monitor. She states she was placed on the NST machine for almost one hour.  She denies contractions, vaginal bleeding, LOF, DFM, fever or recent illness.  Patient receives care with [facility].  OB History     Gravida  1   Para      Term      Preterm      AB      Living         SAB      IAB      Ectopic      Multiple      Live Births              Past Medical History:  Diagnosis Date   ANA positive    SVT (supraventricular tachycardia)    Vitamin D deficiency     Past Surgical History:  Procedure Laterality Date   NO PAST SURGERIES      Family History  Problem Relation Age of Onset   Hypertension Mother    Lupus Father    Heart disease Brother    Heart murmur Brother    Diabetes Maternal Aunt    Diabetes Maternal Grandmother    Stroke Maternal Grandfather    Cancer Paternal Grandmother    Asthma Neg Hx    Birth defects Neg Hx     Social History   Tobacco Use   Smoking status: Never   Smokeless tobacco: Never  Vaping Use   Vaping Use: Never used  Substance Use Topics   Alcohol use: Not Currently   Drug use: Never    Allergies:  Allergies  Allergen Reactions   Cephalexin Hives   Milk-Related Compounds Hives   Wheat Bran Hives   Other Rash    Medications Prior to Admission  Medication Sig Dispense Refill Last Dose   Prenatal Vit-Fe Fumarate-FA (PRENATAL VITAMIN PO) Take by mouth.   09/21/2022    albuterol (VENTOLIN HFA) 108 (90 Base) MCG/ACT inhaler Inhale 2 puffs into the lungs every 6 (six) hours as needed. (Patient not taking: Reported on 04/29/2022)       Review of Systems  All other systems reviewed and are negative.  Physical Exam   Blood pressure 119/75, pulse (!) 102, temperature 98.2 F (36.8 C), temperature source Oral, resp. rate 18, height 5\' 1"  (1.549 m), weight 63.3 kg, last menstrual period 01/27/2022, SpO2 98 %.  Physical Exam Vitals and nursing note reviewed. Exam conducted with a chaperone present.  Constitutional:      Appearance: Normal appearance.  Cardiovascular:     Rate and Rhythm: Normal rate.  Pulmonary:     Effort: Pulmonary effort is normal.  Abdominal:     Comments: Gravid  Skin:    Capillary Refill: Capillary refill takes less than 2 seconds.  Neurological:     Mental Status: She is alert and oriented to person,  place, and time.  Psychiatric:        Mood and Affect: Mood normal.        Behavior: Behavior normal.        Judgment: Judgment normal.     MAU Course  Procedures  MDM --Reactive tracing x 1 hour prolonged monitoring: baseline 150, mod var, + 15 x 15 accels, no decels --Toco: quiet  Patient Vitals for the past 24 hrs:  BP Temp Temp src Pulse Resp SpO2 Height Weight  09/22/22 1230 115/66 -- -- 84 -- -- -- --  09/22/22 1130 119/75 -- -- (!) 102 -- -- -- --  09/22/22 1112 121/68 98.2 F (36.8 C) Oral 95 18 98 % -- --  09/22/22 1107 -- -- -- -- -- -- 5\' 1"  (1.549 m) 63.3 kg   Assessment and Plan  --21 y.o. G1P0 at [redacted]w[redacted]d  --Reactive tracing x 1 hour --Patient pushed NST button to signal fetal movement 42 times during hour of monitoring --Tracing reviewed with CNM, patient and patient's father prior to discharge --Discharge home in stable condition  [redacted]w[redacted]d, MSA, MSN, CNM 09/22/2022, 3:58 PM

## 2022-10-02 ENCOUNTER — Encounter (HOSPITAL_COMMUNITY): Payer: Self-pay | Admitting: Obstetrics and Gynecology

## 2022-10-02 ENCOUNTER — Other Ambulatory Visit: Payer: Self-pay

## 2022-10-02 ENCOUNTER — Observation Stay (HOSPITAL_COMMUNITY)
Admission: AD | Admit: 2022-10-02 | Discharge: 2022-10-03 | Disposition: A | Discharge status: Home / Self Care | Payer: BC Managed Care – PPO | Attending: Obstetrics and Gynecology | Admitting: Obstetrics and Gynecology

## 2022-10-02 ENCOUNTER — Inpatient Hospital Stay (HOSPITAL_BASED_OUTPATIENT_CLINIC_OR_DEPARTMENT_OTHER): Payer: BC Managed Care – PPO

## 2022-10-02 DIAGNOSIS — Z369 Encounter for antenatal screening, unspecified: Secondary | ICD-10-CM | POA: Diagnosis not present

## 2022-10-02 DIAGNOSIS — R1030 Lower abdominal pain, unspecified: Secondary | ICD-10-CM | POA: Insufficient documentation

## 2022-10-02 DIAGNOSIS — O4700 False labor before 37 completed weeks of gestation, unspecified trimester: Secondary | ICD-10-CM

## 2022-10-02 DIAGNOSIS — Z79899 Other long term (current) drug therapy: Secondary | ICD-10-CM | POA: Diagnosis not present

## 2022-10-02 DIAGNOSIS — O4703 False labor before 37 completed weeks of gestation, third trimester: Secondary | ICD-10-CM | POA: Diagnosis not present

## 2022-10-02 DIAGNOSIS — Z3A35 35 weeks gestation of pregnancy: Secondary | ICD-10-CM

## 2022-10-02 DIAGNOSIS — O26893 Other specified pregnancy related conditions, third trimester: Secondary | ICD-10-CM | POA: Diagnosis not present

## 2022-10-02 DIAGNOSIS — O35EXX0 Maternal care for other (suspected) fetal abnormality and damage, fetal genitourinary anomalies, not applicable or unspecified: Secondary | ICD-10-CM

## 2022-10-02 DIAGNOSIS — Z113 Encounter for screening for infections with a predominantly sexual mode of transmission: Secondary | ICD-10-CM | POA: Diagnosis not present

## 2022-10-02 LAB — URINALYSIS, ROUTINE W REFLEX MICROSCOPIC
Bacteria, UA: NONE SEEN
Bilirubin Urine: NEGATIVE
Glucose, UA: NEGATIVE mg/dL
Ketones, ur: NEGATIVE mg/dL
Leukocytes,Ua: NEGATIVE
Nitrite: NEGATIVE
Protein, ur: NEGATIVE mg/dL
Specific Gravity, Urine: 1.004 — ABNORMAL LOW (ref 1.005–1.030)
pH: 7 (ref 5.0–8.0)

## 2022-10-02 LAB — TYPE AND SCREEN
ABO/RH(D): B POS
Antibody Screen: NEGATIVE

## 2022-10-02 MED ORDER — FENTANYL CITRATE (PF) 100 MCG/2ML IJ SOLN
100.0000 ug | Freq: Once | INTRAMUSCULAR | Status: AC
  Administered 2022-10-02: 100 ug via INTRAVENOUS
  Filled 2022-10-02: qty 2

## 2022-10-02 MED ORDER — PRENATAL MULTIVITAMIN CH
1.0000 | ORAL_TABLET | Freq: Every day | ORAL | Status: DC
  Administered 2022-10-03: 1 via ORAL
  Filled 2022-10-02: qty 1

## 2022-10-02 MED ORDER — LACTATED RINGERS IV SOLN: INTRAVENOUS | Status: DC

## 2022-10-02 MED ORDER — ACETAMINOPHEN 325 MG PO TABS: 650.0000 mg | ORAL_TABLET | ORAL | Status: DC | PRN

## 2022-10-02 MED ORDER — OXYCODONE-ACETAMINOPHEN 5-325 MG PO TABS
1.0000 | ORAL_TABLET | ORAL | Status: DC | PRN
  Administered 2022-10-03 (×2): 1 via ORAL
  Filled 2022-10-02 (×2): qty 1

## 2022-10-02 MED ORDER — LACTATED RINGERS IV BOLUS
1000.0000 mL | Freq: Once | INTRAVENOUS | Status: AC
  Administered 2022-10-02: 1000 mL via INTRAVENOUS

## 2022-10-02 MED ORDER — DOCUSATE SODIUM 100 MG PO CAPS: 100.0000 mg | ORAL_CAPSULE | Freq: Every day | ORAL | Status: DC

## 2022-10-02 MED ORDER — CALCIUM CARBONATE ANTACID 500 MG PO CHEW: 2.0000 | CHEWABLE_TABLET | ORAL | Status: DC | PRN

## 2022-10-02 NOTE — MAU Provider Note (Signed)
History     CSN: [id number]  Arrival date and time: 10/02/22 1134   Event Date/Time   First Provider Initiated Contact with Patient 10/02/22 1206      Chief Complaint  Patient presents with   EFM   HPI Nancy Page. [name] is a 21 y.o. G1P0 at [redacted]w[redacted]d who presents to MAU from office for evaluation. Patient reports she started having contractions this morning around 8am. Contractions are every 5-6 minutes apart. She went to the office for evaluation. She was found to be closed/thick but had a non-reactive NST so sent here for further evaluation. She reports good fetal movement. Denies leaking fluid, vaginal bleeding, or urinary s/s.   OB History     Gravida  1   Para      Term      Preterm      AB      Living         SAB      IAB      Ectopic      Multiple      Live Births              Past Medical History:  Diagnosis Date   ANA positive    SVT (supraventricular tachycardia)    Vitamin D deficiency     Past Surgical History:  Procedure Laterality Date   NO PAST SURGERIES      Family History  Problem Relation Age of Onset   Hypertension Mother    Lupus Father    Heart disease Brother    Heart murmur Brother    Diabetes Maternal Aunt    Diabetes Maternal Grandmother    Stroke Maternal Grandfather    Cancer Paternal Grandmother    Asthma Neg Hx    Birth defects Neg Hx     Social History   Tobacco Use   Smoking status: Never   Smokeless tobacco: Never  Vaping Use   Vaping Use: Never used  Substance Use Topics   Alcohol use: Not Currently   Drug use: Never    Allergies:  Allergies  Allergen Reactions   Cephalexin Hives   Milk-Related Compounds Hives   Wheat Bran Hives   Other Rash    Medications Prior to Admission  Medication Sig Dispense Refill Last Dose   Prenatal Vit-Fe Fumarate-FA (PRENATAL VITAMIN PO) Take by mouth.   10/02/2022   albuterol (VENTOLIN HFA) 108 (90 Base) MCG/ACT inhaler Inhale 2 puffs into the lungs every 6  (six) hours as needed. (Patient not taking: Reported on 04/29/2022)   Unknown   Review of Systems  Constitutional: Negative.   Respiratory: Negative.    Cardiovascular: Negative.   Gastrointestinal:  Positive for abdominal pain (contractions).  Genitourinary: Negative.   Musculoskeletal: Negative.   Neurological: Negative.    Physical Exam   Blood pressure (!) 103/52, pulse 98, temperature 98.9 F (37.2 C), temperature source Axillary, resp. rate 15, height 5\' 1"  (1.549 m), weight 64.1 kg, last menstrual period 01/27/2022, SpO2 98 %.  Physical Exam Vitals and nursing note reviewed. Exam conducted with a chaperone present.  Constitutional:      General: She is not in acute distress. Eyes:     Extraocular Movements: Extraocular movements intact.     Pupils: Pupils are equal, round, and reactive to light.  Cardiovascular:     Rate and Rhythm: Normal rate.  Pulmonary:     Effort: Pulmonary effort is normal.  Abdominal:     Palpations: Abdomen is soft.  Tenderness: There is no abdominal tenderness.     Comments: Gravid   Musculoskeletal:        General: Normal range of motion.     Cervical back: Normal range of motion.  Skin:    General: Skin is warm and dry.  Neurological:     General: No focal deficit present.     Mental Status: She is alert and oriented to person, place, and time.  Psychiatric:        Mood and Affect: Mood normal.        Behavior: Behavior normal.   Dilation: 2 Effacement (%): 50 Station: -2 Presentation: Vertex Exam by:: D. Ladajah Soltys CNM  NST FHR: 150 bpm, moderate variability, +15x15 accels, no decels Toco: Q 5-6 mins  MAU Course  Procedures  MDM UA LR bolus NST  NST initially non-reactive and patient contracting every 1-2 minutes so LR bolus given. Cervix FT/Thick.  1250: Recheck cervix 1/50/-3, vtx. NST reactive after IVF's however suspected prolonged deceleration. BPP ordered  3:15: BPP 8/8, AFI wnl. Cervix now 2/50/-2, vtx. NST  reassuring, contractions q 1.5-4 minutes. Patient visibly uncomfortable with contractions. Dr. Normand Sloop notified of patient and recommendation for observation given contractions and cervical change. Nancy Page, CNM to come and discuss plan of care with patient.   Assessment and Plan  [redacted] weeks gestation of pregnancy Contractions Latent labor  - Care handed over to [facility] provider   Brand Males, CNM 10/02/2022, 4:42 PM

## 2022-10-02 NOTE — MAU Note (Signed)
Nancy Page is a 21 y.o. at [redacted]w[redacted]d here in MAU reporting: she was sent over from [facility] office for monitoring.  Reports having ctxs every 5-6 minutes.  Endorses +FM.  Denies LOF or VB. LMP: NA Onset of complaint: today Pain score: 8 Vitals:   10/02/22 1147  BP: 102/62  Pulse: (!) 113  Resp: 19  Temp: 98.2 F (36.8 C)  SpO2: 99%     PJA:[id number] d/t apparel Lab orders placed from triage:   UA

## 2022-10-03 DIAGNOSIS — O4700 False labor before 37 completed weeks of gestation, unspecified trimester: Secondary | ICD-10-CM

## 2022-10-03 LAB — PROTIME-INR
INR: 1.2 (ref 0.8–1.2)
Prothrombin Time: 14.8 seconds (ref 11.4–15.2)

## 2022-10-03 LAB — CULTURE, OB URINE: Culture: <10000 — AB

## 2022-10-03 LAB — APTT: aPTT: 27 seconds (ref 24–36)

## 2022-10-03 LAB — HEMOGLOBIN AND HEMATOCRIT, BLOOD
HCT: 28.1 % — ABNORMAL LOW (ref 36.0–46.0)
HCT: 28.6 % — ABNORMAL LOW (ref 36.0–46.0)
Hemoglobin: 9.3 g/dL — ABNORMAL LOW (ref 12.0–15.0)
Hemoglobin: 9.7 g/dL — ABNORMAL LOW (ref 12.0–15.0)

## 2022-10-03 LAB — PLATELET COUNT: Platelets: 210 10*3/uL (ref 150–400)

## 2022-10-03 LAB — FIBRINOGEN: Fibrinogen: 394 mg/dL (ref 210–475)

## 2022-10-03 MED ORDER — DOCUSATE SODIUM 100 MG PO CAPS: 100.0000 mg | ORAL_CAPSULE | Freq: Two times a day (BID) | ORAL | 3 refills | Status: DC | PRN

## 2022-10-03 MED ORDER — FERROUS SULFATE 325 (65 FE) MG PO TABS: 325.0000 mg | ORAL_TABLET | Freq: Two times a day (BID) | ORAL | 3 refills | Status: DC

## 2022-10-03 MED ORDER — CYCLOBENZAPRINE HCL 10 MG PO TABS: 10.0000 mg | ORAL_TABLET | Freq: Three times a day (TID) | ORAL | 1 refills | Status: DC | PRN

## 2022-10-03 NOTE — H&P (Signed)
OB ADMISSION/ HISTORY & PHYSICAL:  Admission Date: 10/02/2022 11:34 AM  Admit Diagnosis: Abdominal pain  Nancy Page is a 21 y.o. female G1P0 [redacted]w[redacted]d presenting for lower abdominal pain. Endorses active FM, denies LOF and vaginal bleeding. Ctx began @ 0800. Pt was seen in the office for C/O abd cramping that started at 0800. Denied abd trauma and recent intercourse. NST non-reactive - baseline 145, moderate variability, no accels,  Uterine irritability w/ mild ctx Q 1 min. Abd palpated soft, no vaginal bleeding, reports good fetal movement. VE CL/TH/High. Sent to MAU for extended monitoring and to rule out PTL.   Reactive NST and BPP 8/8 in MAU. MAU provider recommended overnight observation for pt's continued complaint of painful contractions.   Maternal hx of SVT. Seen by cardiology, due to SVT/atrial tachycardia, given propranolol prn, no use of med due to use of vagal maneuver. Hx positive ANA at Urgent Care in past (10/2021, titer 1:80), has joint pain, fatigue, since 2019, with questionable "flares". Referred to rheumatology, no evidence of lupus.   History of current pregnancy: G1P0   Patient entered care with [facility] at 10 wks.   EDC 11/03/22 by LMP and congruent w/ 10+1 wk U/S.   Anatomy scan:  19 wks, complete w/ Posterior/Fundal placenta.   Antenatal testing: N/A  Significant prenatal events:  Patient Active Problem List   Diagnosis Date Noted   Preterm labor 10/02/2022    Prenatal Labs: ABO, Rh: --/--/B POS (10/27 1215) Antibody: NEG (10/27 1215) Rubella:   immune RPR:   NR HBsAg:   NR HIV: Non Reactive (02/23 1509)  GTT: normal 1 hr GBS:   collected 10/02/22 GC/CHL: neg/neg Genetics: low-risk female, AFP neg Vaccines: Tdap: declined Influenza: declined   OB History  Gravida Para Term Preterm AB Living  1            SAB IAB Ectopic Multiple Live Births               # Outcome Date GA Lbr Len/2nd Weight Sex Delivery Anes PTL Lv  1 Current             Medical  / Surgical History: Past medical history:  Past Medical History:  Diagnosis Date   ANA positive    SVT (supraventricular tachycardia)    Vitamin D deficiency     Past surgical history:  Past Surgical History:  Procedure Laterality Date   NO PAST SURGERIES     Family History:  Family History  Problem Relation Age of Onset   Hypertension Mother    Lupus Father    Heart disease Brother    Heart murmur Brother    Diabetes Maternal Aunt    Diabetes Maternal Grandmother    Stroke Maternal Grandfather    Cancer Paternal Grandmother    Asthma Neg Hx    Birth defects Neg Hx     Social History:  reports that she has never smoked. She has never used smokeless tobacco. She reports that she does not currently use alcohol. She reports that she does not use drugs.  Allergies: Cephalexin, Milk-related compounds, Wheat bran, and Other   Current Medications at time of admission:  Prior to Admission medications   Medication Sig Start Date End Date Taking? Authorizing Provider  Prenatal Vit-Fe Fumarate-FA (PRENATAL VITAMIN PO) Take by mouth.   Yes [provider]  albuterol (VENTOLIN HFA) 108 (90 Base) MCG/ACT inhaler Inhale 2 puffs into the lungs every 6 (six) hours as needed. Patient not  taking: Reported on 04/29/2022 04/09/21   [provider]  fluticasone (FLONASE) 50 MCG/ACT nasal spray Place 2 sprays into both nostrils daily. 10/03/17 03/18/21  Ok Edwards, PA-C  levonorgestrel-ethinyl estradiol (SEASONALE,INTROVALE,JOLESSA) 0.15-0.03 MG tablet Take 1 tablet by mouth daily.  03/18/21  [provider]    Review of Systems: Constitutional: Negative   HENT: Negative   Eyes: Negative   Respiratory: Negative   Cardiovascular: Negative   Gastrointestinal: Negative  Genitourinary: neg for bloody show, neg for LOF   Musculoskeletal: Negative   Skin: Negative   Neurological: Negative   Endo/Heme/Allergies: Negative   Psychiatric/Behavioral: Negative    Physical  Exam: VS: Blood pressure (!) 94/49, pulse 83, temperature 98.4 F (36.9 C), temperature source Oral, resp. rate 16, height 5\' 1"  (1.549 m), weight 64.1 kg, last menstrual period 01/27/2022, SpO2 98 %. AAO x3, no signs of distress Cardiovascular: RRR Respiratory: Unlabored GU/GI: Abdomen gravid, non-tender, non-distended, active FM, vertex Extremities: no edema, negative for pain, tenderness, and cords  Cervical exam:Dilation: 2 Effacement (%): 50 Station: -2 Exam by:: D. Simpson CNM FHR: baseline rate 145 / variability moderate / accelerations present / absent decelerations TOCO: periods of irritability and occasional regular rhytms of 2-5 min   Prenatal Transfer Tool  Maternal Diabetes: No Genetic Screening: Normal Maternal Ultrasounds/Referrals: Normal Fetal Ultrasounds or other Referrals:  Referred to Materal Fetal Medicine  Maternal Substance Abuse:  No Significant Maternal Medications:  None Significant Maternal Lab Results: Other: GBS collected 10/02/22, results pending Number of Prenatal Visits:greater than 3 verified prenatal visits Other Comments:  None    Assessment: 21 y.o. G1P0 [redacted]w[redacted]d Rule out preterm labor  FHR category 1 GBS collected 10/02/22, results pending  Plan:  Observe on OB Specialty Routine admission orders Continuous EFM IV LR continuous IV Fentanyl x1 dose Oxycodone PRN  Plan developed with Dr Charlesetta Garibaldi  Arrie Eastern DNP, CNM 10/03/2022 7:22 AM

## 2022-10-03 NOTE — Discharge Summary (Signed)
Physician Discharge Summary  Patient ID: Nancy Page MRN: [id number] DOB/AGE: October 17, 2001 21 y.o.  Admit date: 10/02/2022 Discharge date: 10/03/2022  Admission Diagnoses: 1. Single live IUP at [redacted]w[redacted]d 2. Preterm Contractions 3. Abdominal pain  Discharge Diagnoses:  Principal Problem:   Preterm contractions 1. Single live IUP at [redacted]w[redacted]d 2. Preterm Contractions 3. Abdominal pain  Discharged Condition: good  Hospital Course: Patient is a G1P0 at [redacted]w[redacted]d admitted on 10/03/2022 for observation for lower abdominal pain. She was seen in the office on 10/03/2022 for abdominal cramping and she was placed on the NST. The NST was non-reactive with both uterine irritability and mild contractions every minute. Her cervix was closed/thick/high. She was sent to MAU to rule out PTL and for fetal monitoring. When she arrived to MAU, her NST was reactive and BPP was 8/8. It was recommended that she be observed overnight due to painful contractions.  Over the course of her hospital stay, patient has had overall very reassuring fetal heart tracing. On the evening of 10/27 and early morning of 10/28, she was noted to be subtle late decelerations after occasional contractions that resolved with position change. Prior to discharge home, fetal heart rate baseline was 150bpm, moderate variability, + accel, - decels. No contractions were noted. During her hospital stay, she received IVFs and Percocet x 2 for pain. Her pain score was 0/10 prior to discharge. Her cervix remained closed/thick/ballotable. She reported minimal dark brown discharge that was noted after multiple cervical exams. No sign of placental abruption was noted - her abdomen remained soft and she only had mild LLQ tenderness without rebound/guarding. Coagulopathy studies were within normal limits. She was noted to have a Hgb of 9.3 (from baseline of 11.2 one month ago) that remained stable over multiple hours (repeat was 9.7). She was discharged home in  stable condition with Flexeril PRN, ferrous sulfate, and Colace. Abdominal binder was ordered as well. She has follow-up in the office on 10/07/2022.  Consults: None  Significant Diagnostic Studies: labs:  CBC    Component Value Date/Time   WBC 9.0 09/03/2022 1407   RBC 3.89 09/03/2022 1407   HGB 9.7 (L) 10/03/2022 1553   HGB 13.6 10/24/2021 1553   HCT 28.6 (L) 10/03/2022 1553   HCT 41.0 10/24/2021 1553   PLT 210 10/03/2022 1116   PLT 358 10/24/2021 1553   MCV 88.7 09/03/2022 1407   MCV 91 10/24/2021 1553   MCH 28.8 09/03/2022 1407   MCHC 32.5 09/03/2022 1407   RDW 12.6 09/03/2022 1407   RDW 12.8 10/24/2021 1553   LYMPHSABS 1.7 09/03/2022 1407   LYMPHSABS 2.8 10/24/2021 1553   MONOABS 0.8 09/03/2022 1407   EOSABS 0.0 09/03/2022 1407   EOSABS 0.0 10/24/2021 1553   BASOSABS 0.0 09/03/2022 1407   BASOSABS 0.0 10/24/2021 1553   Urinalysis    Component Value Date/Time   COLORURINE STRAW (A) 10/02/2022 1201   APPEARANCEUR CLEAR 10/02/2022 1201   LABSPEC 1.004 (L) 10/02/2022 1201   PHURINE 7.0 10/02/2022 1201   GLUCOSEU NEGATIVE 10/02/2022 1201   HGBUR SMALL (A) 10/02/2022 1201   BILIRUBINUR NEGATIVE 10/02/2022 1201   BILIRUBINUR negative 06/24/2021 1557   KETONESUR NEGATIVE 10/02/2022 1201   PROTEINUR NEGATIVE 10/02/2022 1201   UROBILINOGEN 1.0 06/24/2021 1557   UROBILINOGEN 0.2 08/13/2019 1150   NITRITE NEGATIVE 10/02/2022 1201   LEUKOCYTESUR NEGATIVE 10/02/2022 1201   Urine culture: Negative  Coagulopathy panel: Normal  BPP (10/27):  Narrative & Impression  ----------------------------------------------------------------------  OBSTETRICS REPORT                       (  Signed Final 10/02/2022 05:02 pm) ---------------------------------------------------------------------- Patient Info  ID #:       [id number]                          D.O.B.:  2001/09/18 (21 yrs)  Name:       Nancy Page                 Visit Date: 10/02/2022 02:52  pm ---------------------------------------------------------------------- Performed By  Attending:        Ma Rings MD         Secondary Phy.:   [facility] MAU/Triage  Performed By:     Sandi Mealy        [facility]  Referred By:      Brand Males ---------------------------------------------------------------------- Orders  #  Description                           Code        Ordered By  1  [place] MFM FETAL BPP WO NON               11941.74    DANIELLE SIMPSON     STRESS ----------------------------------------------------------------------  #  Order #                     Accession #                Episode #  1  [id number]                   [id number]                 [id number] ---------------------------------------------------------------------- Indications  Pyelectasis of fetus on prenatal ultrasound    O28.3  Medical complication of pregnancy (hx SVT)     O26.90  [redacted] weeks gestation of pregnancy                Z3A.35  Positive ANA  Encounter for other antenatal screening        Z36.2  follow-up ---------------------------------------------------------------------- Fetal Evaluation  Num Of Fetuses:         1  Fetal Heart Rate(bpm):  151  Cardiac Activity:       Observed  Presentation:           Cephalic  Placenta:               Posterior  P. Cord Insertion:      Visualized  Amniotic Fluid  AFI FV:      Within normal limits  AFI Sum(cm)     %Tile       Largest Pocket(cm)  10.7            26          3.4  RUQ(cm)       RLQ(cm)       LUQ(cm)  LLQ(cm)  2.2           2.6           3.4            2.5 ---------------------------------------------------------------------- Biophysical Evaluation  Amniotic F.V:   Within normal limits       F. Tone:        Observed  F. Movement:    Observed                   Score:          8/8  F. Breathing:    Observed ---------------------------------------------------------------------- OB History  Gravidity:    1  Living:       0 ---------------------------------------------------------------------- Gestational Age  LMP:           35w 3d        Date:  01/27/22                  EDD:   11/03/22  Best:          Consuello Closs 3d     Det. By:  LMP  (01/27/22)          EDD:   11/03/22 ---------------------------------------------------------------------- Comments  This patient presented to the MAU due to frequent  contractions.  A biophysical profile performed today was 8/8.  The AFI was 10.7 cm (within normal limits).  The fetus is in the vertex presentation. ----------------------------------------------------------------------                   Ma Rings, MD Electronically Signed Final Report   10/02/2022 05:02 pm    Treatments: IV hydration, analgesia: Percocet, and abdominal binder  Discharge Exam: Blood pressure 116/73, pulse 89, temperature 98.1 F (36.7 C), temperature source Oral, resp. rate 16, height 5\' 1"  (1.549 m), weight 64.1 kg, last menstrual period 01/27/2022, SpO2 99 %. Gen: NAD, pleasant, comfortable-appearing Abd: Soft, gravid, mild tenderness in LLQ, no rebound/guarding Ext: No bilateral LE edema, SCDs on and functioning   Disposition: Discharge disposition: 01-Home or Self Care       Discharge Instructions     Call MD for:  difficulty breathing, headache or visual disturbances   Complete by: As directed    Call MD for:  extreme fatigue   Complete by: As directed    Call MD for:  hives   Complete by: As directed    Call MD for:  persistant dizziness or light-headedness   Complete by: As directed    Call MD for:  persistant nausea and vomiting   Complete by: As directed    Call MD for:  severe uncontrolled pain   Complete by: As directed    Call MD for:  temperature >100.4   Complete by: As directed    Diet - low sodium heart healthy   Complete by: As  directed    Increase activity slowly   Complete by: As directed       Allergies as of 10/03/2022       Reactions   Cephalexin Hives   Milk-related Compounds Hives   Wheat Bran Hives   Other Rash        Medication List     TAKE these medications    albuterol 108 (90 Base) MCG/ACT inhaler Commonly known as: VENTOLIN HFA Inhale 2 puffs into the lungs every 6 (six) hours as needed.   cyclobenzaprine 10 MG tablet Commonly known as: FLEXERIL Take 1 tablet (10 mg total) by mouth  every 8 (eight) hours as needed for muscle spasms. Do not use prior to driving as it can cause drowsiness.   docusate sodium 100 MG capsule Commonly known as: Colace Take 1 capsule (100 mg total) by mouth 2 (two) times daily as needed for mild constipation or moderate constipation.   ferrous sulfate 325 (65 FE) MG tablet Take 1 tablet (325 mg total) by mouth 2 (two) times daily with a meal.   PRENATAL VITAMIN PO Take by mouth.        Follow-up Information     [facility], [place] Follow up in 4 day(s).   Specialty: Obstetrics and Gynecology Why: Please make sure you keep your OB visit on 10/07/2022 as previously scheduled. Contact information: [address] [place] [address] [phone]                 Signed: Drema Dallas 10/03/2022, 5:00 PM

## 2022-10-03 NOTE — Progress Notes (Addendum)
Provider has reviewed strip prior to discharge. AVS Discharge Instructions have been given. Pt and family members at the bedside. Pt is alert and oriented, free of signs of pain, vaginal bleeding, and LOF. She voices no feeling of pressure or contractions. Pt voiced understanding of discharge instructions and was educated on signs of when to return to hospital. Patient has confirmed follow-up appointment on Tuesday. Pt is ambulatory and accompanied by family at time of departure.

## 2022-10-03 NOTE — Progress Notes (Addendum)
Discussion with patient and her parents RE: preterm labor, differing cervical exams, vaginal bleeding, fetal surveillance, comfort measures, and reasons to remain in hospital or return to hospital if discharged in the pregnant status. Approx 45 min spent in discussion with pt and family. All voiced understanding of pt maternal and fetal care. RN present for discussion.   Fetal surveillance - periods of minimal variability during the night with return to moderate variability without intervention (presumed sleep cycle). Occasional late deceleration during the night that resolved spontaneously. During AM rounds, minimal variability noted. Moderate variability returned with maternal position change. Collective FHR surveillance Cat 1.   Subjective:    Reports a restful night after IV fentanyl with the ability to sleep through "some of the contractions". Lower abd pain continues. Reports feeling active FM, denies LOF, scant brownish discharge. Pt's parents present and supportive.   Objective:    VS: BP (!) 94/49 (BP Location: Right Arm)   Pulse 83   Temp 98.4 F (36.9 C) (Oral)   Resp 16   Ht 5\' 1"  (1.549 m)   Wt 64.1 kg   LMP 01/27/2022   SpO2 98%   BMI 26.70 kg/m  FHR : baseline 145 / variability moderate / accelerations present / absent decelerations Toco: contractions irregular with periods of irritability Abd soft, non-tender No vaginal bleeding  Membranes: intact Dilation: Closed Effacement (%): 20 Cervical Position: Anterior Station: Ballotable Presentation: Vertex Exam by:: Burman Foster, CNM   Assessment/Plan:   21 y.o. G1P0 [redacted]w[redacted]d Abdominal pain Cat 1 FHR surveillance   All findings discussed with Dr. Delora Fuel. MD to evaluate later today to determine next steps.   [place], CNM 10/03/2022 8:50 AM   Attending Attestation: Agree with note as outlined above. I have seen the patient this morning. She just received Percocet 5-325mg  x 1 at 0832. Her pain is 0/10  currently. She ate breakfast without any issues. She reports pain that starts in the vagina and radiates to the sides of her abdomen and maybe even into the lower back. She reported this morning that she had some dark brown discharge. Patient appears very comfortable and is resting in left lateral decubitus position. Abdomen is soft, gravid, and with mild tenderness in the LLQ, no rebound/guarding. Overnight, fetal heart tracing has been overall reassuring; however, after occasional contractions there have been subtle late decelerations which resolve with position change. Suspect round ligament pain vs discomforts of pregnancy vs preterm contractions (labor ruled out, cervix remains closed) vs unlikely placental abruption. Suspect dark brown discharge due to multiple cervical checks. BPP on 10/27 was 8/8. Coag panel obtained. Will perform further extended fetal monitoring today and pain symptoms and re-evaluate for discharge home this afternoon with close office follow-up.  Drema Dallas, DO

## 2022-10-05 ENCOUNTER — Telehealth (HOSPITAL_COMMUNITY): Payer: Self-pay

## 2022-10-05 NOTE — Telephone Encounter (Signed)
Preadmission screening 

## 2022-10-14 ENCOUNTER — Inpatient Hospital Stay (HOSPITAL_COMMUNITY)
Admission: AD | Admit: 2022-10-14 | Discharge: 2022-10-14 | Disposition: A | Discharge status: Home / Self Care | Payer: BC Managed Care – PPO | Attending: Obstetrics and Gynecology | Admitting: Obstetrics and Gynecology

## 2022-10-14 ENCOUNTER — Encounter (HOSPITAL_COMMUNITY): Payer: Self-pay | Admitting: Obstetrics and Gynecology

## 2022-10-14 DIAGNOSIS — Z0371 Encounter for suspected problem with amniotic cavity and membrane ruled out: Secondary | ICD-10-CM | POA: Insufficient documentation

## 2022-10-14 DIAGNOSIS — Z3A37 37 weeks gestation of pregnancy: Secondary | ICD-10-CM | POA: Insufficient documentation

## 2022-10-14 DIAGNOSIS — O471 False labor at or after 37 completed weeks of gestation: Secondary | ICD-10-CM | POA: Diagnosis not present

## 2022-10-14 LAB — POCT FERN TEST: POCT Fern Test: NEGATIVE

## 2022-10-14 NOTE — MAU Provider Note (Signed)
Event Date/Time   First Provider Initiated Contact with Patient 10/14/22 [phone]       S: Nancy Page is a 21 y.o. G1P0 at [redacted]w[redacted]d  who presents to MAU today complaining of leaking of fluid since this morning. She denies vaginal bleeding. She denies contractions. She reports normal fetal movement.    O: BP 108/70   Pulse (!) 106   Temp 98.3 F (36.8 C) (Oral)   Resp 16   Ht 5\' 1"  (1.549 m)   Wt 65.5 kg   LMP 01/27/2022   SpO2 99%   BMI 27.28 kg/m  GENERAL: Well-developed, well-nourished female in no acute distress.  HEAD: Normocephalic, atraumatic.  CHEST: Normal effort of breathing, regular heart rate ABDOMEN: Soft, nontender, gravid PELVIC: Normal external female genitalia. Vagina is pink and rugated. Cervix with normal contour, no lesions. Normal discharge.  No pooling.   Cervical exam: deferred     Fetal Monitoring: Baseline: 150x Variability: moderate Accelerations: 15x15 Decelerations: no Contractions: irregular  Results for orders placed or performed during the hospital encounter of 10/14/22 (from the past 24 hour(s))  Fern Test     Status: Normal   Collection Time: 10/14/22  8:54 AM  Result Value Ref Range   POCT Fern Test Negative = intact amniotic membranes      A: SIUP at [redacted]w[redacted]d  Membranes intact  P: Discharge home Keep scheduled f/u with OB on Monday Labor precautions & kick counts  Monday, NP 10/14/2022 8:54 AM

## 2022-10-14 NOTE — MAU Note (Addendum)
...  Nancy Page is a 21 y.o. at [redacted]w[redacted]d here in MAU reporting: At 0700 this morning she woke up and rolled over and felt a gush of fluids. She reports since then she has continued to have small trickles of fluid. Denies VB. +FM.  Unsure of GBS. Patient reports she does not think it has come back yet. She reports during her pregnancy she has had occasional episodes of Tachycardia and has been referred to a cardiologist. HR in triage 115.  Onset of complaint: 0700  Pain score: Denies pain.  FHT: 142 initial external Lab orders placed from triage: Crist Fat

## 2022-10-19 DIAGNOSIS — Z8759 Personal history of other complications of pregnancy, childbirth and the puerperium: Secondary | ICD-10-CM | POA: Diagnosis not present

## 2022-10-19 DIAGNOSIS — Z3A37 37 weeks gestation of pregnancy: Secondary | ICD-10-CM | POA: Diagnosis not present

## 2022-11-01 ENCOUNTER — Encounter (HOSPITAL_COMMUNITY): Payer: Self-pay | Admitting: Obstetrics and Gynecology

## 2022-11-01 ENCOUNTER — Other Ambulatory Visit: Payer: Self-pay

## 2022-11-01 ENCOUNTER — Inpatient Hospital Stay (HOSPITAL_COMMUNITY)
Admission: AD | Admit: 2022-11-01 | Discharge: 2022-11-04 | DRG: 768 | Disposition: A | Discharge status: Home / Self Care | Payer: BC Managed Care – PPO | Attending: Obstetrics and Gynecology | Admitting: Obstetrics and Gynecology

## 2022-11-01 DIAGNOSIS — I471 Supraventricular tachycardia, unspecified: Secondary | ICD-10-CM | POA: Diagnosis not present

## 2022-11-01 DIAGNOSIS — Z3A39 39 weeks gestation of pregnancy: Secondary | ICD-10-CM

## 2022-11-01 DIAGNOSIS — O26893 Other specified pregnancy related conditions, third trimester: Secondary | ICD-10-CM | POA: Diagnosis not present

## 2022-11-01 DIAGNOSIS — D62 Acute posthemorrhagic anemia: Secondary | ICD-10-CM | POA: Diagnosis not present

## 2022-11-01 DIAGNOSIS — O9081 Anemia of the puerperium: Secondary | ICD-10-CM | POA: Diagnosis not present

## 2022-11-01 DIAGNOSIS — O99419 Diseases of the circulatory system complicating pregnancy, unspecified trimester: Secondary | ICD-10-CM | POA: Diagnosis not present

## 2022-11-01 LAB — POCT FERN TEST: POCT Fern Test: POSITIVE

## 2022-11-01 MED ORDER — FENTANYL CITRATE (PF) 100 MCG/2ML IJ SOLN
50.0000 ug | INTRAMUSCULAR | Status: DC | PRN
  Administered 2022-11-02: 50 ug via INTRAVENOUS
  Filled 2022-11-01: qty 2

## 2022-11-01 MED ORDER — LIDOCAINE HCL (PF) 1 % IJ SOLN
30.0000 mL | INTRAMUSCULAR | Status: AC | PRN
  Administered 2022-11-02: 30 mL via SUBCUTANEOUS
  Filled 2022-11-01: qty 30

## 2022-11-01 MED ORDER — ONDANSETRON HCL 4 MG/2ML IJ SOLN: 4.0000 mg | Freq: Four times a day (QID) | INTRAMUSCULAR | Status: DC | PRN

## 2022-11-01 MED ORDER — OXYCODONE-ACETAMINOPHEN 5-325 MG PO TABS: 2.0000 | ORAL_TABLET | ORAL | Status: DC | PRN

## 2022-11-01 MED ORDER — LACTATED RINGERS IV SOLN
500.0000 mL | INTRAVENOUS | Status: DC | PRN
  Administered 2022-11-01: 1000 mL via INTRAVENOUS
  Administered 2022-11-02: 500 mL via INTRAVENOUS

## 2022-11-01 MED ORDER — ACETAMINOPHEN 325 MG PO TABS: 650.0000 mg | ORAL_TABLET | ORAL | Status: DC | PRN

## 2022-11-01 MED ORDER — OXYCODONE-ACETAMINOPHEN 5-325 MG PO TABS: 1.0000 | ORAL_TABLET | ORAL | Status: DC | PRN

## 2022-11-01 MED ORDER — SOD CITRATE-CITRIC ACID 500-334 MG/5ML PO SOLN: 30.0000 mL | ORAL | Status: DC | PRN

## 2022-11-01 MED ORDER — LACTATED RINGERS IV SOLN: INTRAVENOUS | Status: DC

## 2022-11-01 MED ORDER — ALBUTEROL SULFATE HFA 108 (90 BASE) MCG/ACT IN AERS: 2.0000 | INHALATION_SPRAY | Freq: Four times a day (QID) | RESPIRATORY_TRACT | Status: DC | PRN

## 2022-11-01 MED ORDER — ALBUTEROL SULFATE (2.5 MG/3ML) 0.083% IN NEBU: 2.5000 mg | INHALATION_SOLUTION | Freq: Four times a day (QID) | RESPIRATORY_TRACT | Status: DC | PRN

## 2022-11-01 MED ORDER — OXYTOCIN BOLUS FROM INFUSION
333.0000 mL | Freq: Once | INTRAVENOUS | Status: AC
  Administered 2022-11-02: 333 mL via INTRAVENOUS

## 2022-11-01 MED ORDER — OXYTOCIN-SODIUM CHLORIDE 30-0.9 UT/500ML-% IV SOLN: 2.5000 [IU]/h | INTRAVENOUS | Status: DC

## 2022-11-01 NOTE — MAU Note (Signed)
.  Nancy Page is a 21 y.o. at [redacted]w[redacted]d here in MAU reporting:  Ctx that started at 6pm about apart. NO LOF, NO bleeding. +fm.  Rates pain at 7/10 lower abdominal pain.   Vitals:   11/01/22 2009  BP: 135/79  Pulse: 81  Resp: 16  Temp: 98 F (36.7 C)  SpO2: 100%     FHT:143 Lab orders placed from triage:   labor eval.

## 2022-11-01 NOTE — H&P (Addendum)
Nancy Page is a 21 y.o. female presenting for contractions.  While in the MAU, pt had LOF with confirmed SROM by  MAU staff.  No VB.  OB History     Gravida  1   Para      Term      Preterm      AB      Living         SAB      IAB      Ectopic      Multiple      Live Births             Past Medical History:  Diagnosis Date   ANA positive    SVT (supraventricular tachycardia)    Vitamin D deficiency    Past Surgical History:  Procedure Laterality Date   NO PAST SURGERIES     Family History: family history includes Cancer in her paternal grandmother; Diabetes in her maternal aunt and maternal grandmother; Heart disease in her brother; Heart murmur in her brother; Hypertension in her mother; Lupus in her father; Stroke in her maternal grandfather. Social History:  reports that she has never smoked. She has never used smokeless tobacco. She reports that she does not currently use alcohol. She reports that she does not use drugs.     Maternal Diabetes: No Genetic Screening: Normal Maternal Ultrasounds/Referrals: Normal Fetal Ultrasounds or other Referrals:  None Maternal Substance Abuse:  No Significant Maternal Medications:  None Significant Maternal Lab Results:  Group B Strep negative Number of Prenatal Visits:greater than 3 verified prenatal visits Other Comments:   Mom tested ANA + but neg w/u for Lupus and Sjogrens  Review of Systems No F/C/N/V/D  History Dilation: 4 Effacement (%): 60 Station: -2 Exam by:: Felipa Furnace RN [id number] Blood pressure 135/79, pulse 81, temperature 98 F (36.7 C), temperature source Oral, resp. rate 16, height 5\' 1"  (1.549 m), weight 66.9 kg, last menstrual period 01/27/2022, SpO2 100 %. Exam Physical Exam  Lungs CTA CV RRR Abdomen gravid, NT Extremities no calf tenderness  Prenatal labs: ABO, Rh: --/--/B POS (10/27 1215) Antibody: NEG (10/27 1215) Rubella: Immune (06/08 0000) RPR:   NR HBsAg: Negative  (06/08 0000)  HIV: Non-reactive (06/08 0000)  GBS:   Negative  Assessment/Plan: 21yo G3P0020 at 39 5/7wks being admitted in early labor.  Cat 1 tracing.  Pain medicine upon request.  07-12-1991 11/01/2022, 10:05 PM

## 2022-11-02 ENCOUNTER — Inpatient Hospital Stay (HOSPITAL_COMMUNITY): Payer: BC Managed Care – PPO | Admitting: Anesthesiology

## 2022-11-02 ENCOUNTER — Encounter (HOSPITAL_COMMUNITY): Payer: Self-pay | Admitting: Obstetrics and Gynecology

## 2022-11-02 LAB — CBC
HCT: 32.7 % — ABNORMAL LOW (ref 36.0–46.0)
Hemoglobin: 10.9 g/dL — ABNORMAL LOW (ref 12.0–15.0)
MCH: 28.1 pg (ref 26.0–34.0)
MCHC: 33.3 g/dL (ref 30.0–36.0)
MCV: 84.3 fL (ref 80.0–100.0)
Platelets: 231 10*3/uL (ref 150–400)
RBC: 3.88 MIL/uL (ref 3.87–5.11)
RDW: 14.6 % (ref 11.5–15.5)
WBC: 9.7 10*3/uL (ref 4.0–10.5)
nRBC: 0 % (ref 0.0–0.2)

## 2022-11-02 LAB — RPR: RPR Ser Ql: NONREACTIVE

## 2022-11-02 MED ORDER — FENTANYL-BUPIVACAINE-NACL 0.5-0.125-0.9 MG/250ML-% EP SOLN
12.0000 mL/h | EPIDURAL | Status: DC | PRN
  Administered 2022-11-02: 12 mL/h via EPIDURAL
  Filled 2022-11-02: qty 250

## 2022-11-02 MED ORDER — DIBUCAINE (PERIANAL) 1 % EX OINT
1.0000 | TOPICAL_OINTMENT | CUTANEOUS | Status: DC | PRN
  Administered 2022-11-02: 1 via RECTAL
  Filled 2022-11-02: qty 28

## 2022-11-02 MED ORDER — DIPHENHYDRAMINE HCL 50 MG/ML IJ SOLN: 12.5000 mg | INTRAMUSCULAR | Status: DC | PRN

## 2022-11-02 MED ORDER — EPHEDRINE 5 MG/ML INJ: 10.0000 mg | INTRAVENOUS | Status: DC | PRN

## 2022-11-02 MED ORDER — ZOLPIDEM TARTRATE 5 MG PO TABS: 5.0000 mg | ORAL_TABLET | Freq: Every evening | ORAL | Status: DC | PRN

## 2022-11-02 MED ORDER — BENZOCAINE-MENTHOL 20-0.5 % EX AERO
1.0000 | INHALATION_SPRAY | CUTANEOUS | Status: DC | PRN
  Administered 2022-11-02 – 2022-11-04 (×2): 1 via TOPICAL
  Filled 2022-11-02 (×2): qty 56

## 2022-11-02 MED ORDER — ACETAMINOPHEN 325 MG PO TABS
650.0000 mg | ORAL_TABLET | ORAL | Status: DC | PRN
  Administered 2022-11-03 – 2022-11-04 (×3): 650 mg via ORAL
  Filled 2022-11-02 (×4): qty 2

## 2022-11-02 MED ORDER — ONDANSETRON HCL 4 MG PO TABS: 4.0000 mg | ORAL_TABLET | ORAL | Status: DC | PRN

## 2022-11-02 MED ORDER — TETANUS-DIPHTH-ACELL PERTUSSIS 5-2.5-18.5 LF-MCG/0.5 IM SUSY: 0.5000 mL | PREFILLED_SYRINGE | Freq: Once | INTRAMUSCULAR | Status: DC

## 2022-11-02 MED ORDER — DOCUSATE SODIUM 100 MG PO CAPS
100.0000 mg | ORAL_CAPSULE | Freq: Two times a day (BID) | ORAL | Status: DC
  Administered 2022-11-02 – 2022-11-04 (×5): 100 mg via ORAL
  Filled 2022-11-02 (×5): qty 1

## 2022-11-02 MED ORDER — OXYCODONE HCL 5 MG PO TABS
10.0000 mg | ORAL_TABLET | ORAL | Status: DC | PRN
  Administered 2022-11-03 – 2022-11-04 (×5): 10 mg via ORAL
  Filled 2022-11-02 (×5): qty 2

## 2022-11-02 MED ORDER — OXYTOCIN-SODIUM CHLORIDE 30-0.9 UT/500ML-% IV SOLN
1.0000 m[IU]/min | INTRAVENOUS | Status: DC
  Administered 2022-11-02: 1 m[IU]/min via INTRAVENOUS
  Filled 2022-11-02: qty 500

## 2022-11-02 MED ORDER — LACTATED RINGERS IV SOLN
500.0000 mL | Freq: Once | INTRAVENOUS | Status: AC
  Administered 2022-11-02: 500 mL via INTRAVENOUS

## 2022-11-02 MED ORDER — PRENATAL MULTIVITAMIN CH
1.0000 | ORAL_TABLET | Freq: Every day | ORAL | Status: DC
  Administered 2022-11-02 – 2022-11-04 (×3): 1 via ORAL
  Filled 2022-11-02 (×3): qty 1

## 2022-11-02 MED ORDER — LIDOCAINE HCL (PF) 1 % IJ SOLN
INTRAMUSCULAR | Status: DC | PRN
  Administered 2022-11-02: 6 mL via EPIDURAL
  Administered 2022-11-02: 4 mL via EPIDURAL

## 2022-11-02 MED ORDER — OXYCODONE HCL 5 MG PO TABS
5.0000 mg | ORAL_TABLET | ORAL | Status: DC | PRN
  Administered 2022-11-02 – 2022-11-03 (×4): 5 mg via ORAL
  Filled 2022-11-02 (×4): qty 1

## 2022-11-02 MED ORDER — TERBUTALINE SULFATE 1 MG/ML IJ SOLN: 0.2500 mg | Freq: Once | INTRAMUSCULAR | Status: DC | PRN

## 2022-11-02 MED ORDER — LIDOCAINE HCL (PF) 1 % IJ SOLN
INTRAMUSCULAR | Status: AC
  Administered 2022-11-02: 30 mL
  Filled 2022-11-02: qty 30

## 2022-11-02 MED ORDER — IBUPROFEN 600 MG PO TABS
600.0000 mg | ORAL_TABLET | Freq: Four times a day (QID) | ORAL | Status: DC
  Administered 2022-11-02 – 2022-11-04 (×10): 600 mg via ORAL
  Filled 2022-11-02 (×10): qty 1

## 2022-11-02 MED ORDER — PHENYLEPHRINE 80 MCG/ML (10ML) SYRINGE FOR IV PUSH (FOR BLOOD PRESSURE SUPPORT): 80.0000 ug | PREFILLED_SYRINGE | INTRAVENOUS | Status: DC | PRN

## 2022-11-02 MED ORDER — DIPHENHYDRAMINE HCL 25 MG PO CAPS: 25.0000 mg | ORAL_CAPSULE | Freq: Four times a day (QID) | ORAL | Status: DC | PRN

## 2022-11-02 MED ORDER — PHENYLEPHRINE 80 MCG/ML (10ML) SYRINGE FOR IV PUSH (FOR BLOOD PRESSURE SUPPORT)
80.0000 ug | PREFILLED_SYRINGE | INTRAVENOUS | Status: DC | PRN
  Administered 2022-11-02: 80 ug via INTRAVENOUS
  Filled 2022-11-02: qty 10

## 2022-11-02 MED ORDER — WITCH HAZEL-GLYCERIN EX PADS
1.0000 | MEDICATED_PAD | CUTANEOUS | Status: DC | PRN
  Administered 2022-11-02 – 2022-11-04 (×3): 1 via TOPICAL

## 2022-11-02 MED ORDER — SENNOSIDES-DOCUSATE SODIUM 8.6-50 MG PO TABS
2.0000 | ORAL_TABLET | Freq: Every day | ORAL | Status: DC
  Administered 2022-11-03 – 2022-11-04 (×2): 2 via ORAL
  Filled 2022-11-02 (×2): qty 2

## 2022-11-02 MED ORDER — COCONUT OIL OIL: 1.0000 | TOPICAL_OIL | Status: DC | PRN

## 2022-11-02 MED ORDER — ONDANSETRON HCL 4 MG/2ML IJ SOLN: 4.0000 mg | INTRAMUSCULAR | Status: DC | PRN

## 2022-11-02 MED ORDER — SIMETHICONE 80 MG PO CHEW: 80.0000 mg | CHEWABLE_TABLET | ORAL | Status: DC | PRN

## 2022-11-02 NOTE — Progress Notes (Signed)
Nancy Page is a 21 y.o. G1P0 at [redacted]w[redacted]d  Subjective: Breathing through ctxs she rates 7/10.    Objective: BP 127/73   Pulse 83   Temp (!) 97.5 F (36.4 C) (Axillary)   Resp 16   Ht 5\' 1"  (1.549 m)   Wt 66.9 kg   LMP 01/27/2022   SpO2 100%   BMI 27.85 kg/m  No intake/output data recorded. No intake/output data recorded.  FHT:  FHR: 150 bpm, variability: min-mod,  accelerations:  Present,  decelerations:  Absent UC:   regular, every 3 minutes SVE:   Dilation: 6 Effacement (%): 80 Station: -1 Exam by:: [email or web address] MD Forebag ruptured and IUPC placed, clear fluid  Labs: Lab Results  Component Value Date   WBC 9.7 11/01/2022   HGB 10.9 (L) 11/01/2022   HCT 32.7 (L) 11/01/2022   MCV 84.3 11/01/2022   PLT 231 11/01/2022    Assessment / Plan:    Labor:  progressing slowly, will augment with pitocin to MVUs 180-220.   Preeclampsia:   n/a Fetal Wellbeing:   overall cat 1 Pain Control:   labor support without medication currently.  Pain medicine upon request I/D:   GBS neg Anticipated MOD:  NSVD  11/03/2022, MD 11/02/2022, 4:02 AM

## 2022-11-02 NOTE — Anesthesia Preprocedure Evaluation (Signed)
Anesthesia Evaluation  Patient identified by MRN, date of birth, ID band Patient awake    Reviewed: Allergy & Precautions, H&P , NPO status , Patient's Chart, lab work & pertinent test results  History of Anesthesia Complications Negative for: history of anesthetic complications  Airway Mallampati: II  TM Distance: >3 FB     Dental   Pulmonary neg pulmonary ROS   Pulmonary exam normal        Cardiovascular + dysrhythmias Supra Ventricular Tachycardia  Rhythm:regular Rate:Normal     Neuro/Psych negative neurological ROS  negative psych ROS   GI/Hepatic negative GI ROS, Neg liver ROS,,,  Endo/Other  negative endocrine ROS    Renal/GU negative Renal ROS  negative genitourinary   Musculoskeletal   Abdominal   Peds  Hematology negative hematology ROS (+)   Anesthesia Other Findings   Reproductive/Obstetrics (+) Pregnancy                              Anesthesia Physical Anesthesia Plan  ASA: 2  Anesthesia Plan: Epidural   Post-op Pain Management:    Induction:   PONV Risk Score and Plan:   Airway Management Planned:   Additional Equipment:   Intra-op Plan:   Post-operative Plan:   Informed Consent: I have reviewed the patients History and Physical, chart, labs and discussed the procedure including the risks, benefits and alternatives for the proposed anesthesia with the patient or authorized representative who has indicated his/her understanding and acceptance.       Plan Discussed with:   Anesthesia Plan Comments:          Anesthesia Quick Evaluation

## 2022-11-02 NOTE — Lactation Note (Addendum)
This note was copied from a baby's chart. Lactation Consultation Note  Patient Name: Nancy Page [id number] Date: 11/02/2022 Reason for consult: L&D Initial assessment;Term;1st time breastfeeding;Primapara (LC attempted to see dyad @ 50 mins and still being repaired per L/D RN) Age: 23 mins  L/D RN to call Lactation when mom is done being repaired.  Maternal Data - no risk     Feeding Mother's Current Feeding Choice: Breast Milk and Formula   Consult Status Consult Status: Follow-up from L&D Date: 11/02/22 Follow-up type: In-patient    Nancy Page 11/02/2022, 8:01 AM

## 2022-11-02 NOTE — Lactation Note (Signed)
This note was copied from a baby's chart. Lactation Consultation Note  Patient Name: Nancy Page [id number] Date: 11/02/2022 Reason for consult: Follow-up assessment;Term;Primapara;1st time breastfeeding Age:23 hours   P1: Term infant at 39+6 weeks Feeding preference: Breast  Upon chart review I noticed that "[name]" has not breast fed since delivery.  Offered to assist with waking and latching; mother receptive.  Reviewed hand expression; one drop noted.  Assisted to latch easily, however, "[name]" remains sleepy and would not initiate a suck.  Demonstrated breast compressions and gentle stimulation; no suck initiated.  Burped him and attempted again but he remains too sleepy.  Suggested mother hold him STS after she finishes her dinner and continue to attempt latching.  Encouraged lots of STS, breast massage and hand expression.  Asked mother to call her RN/LC for latch assistance as needed.     Maternal Data    Feeding Mother's Current Feeding Choice: Breast Milk  LATCH Score Latch: Repeated attempts needed to sustain latch, nipple held in mouth throughout feeding, stimulation needed to elicit sucking reflex.  Audible Swallowing: None  Type of Nipple: Everted at rest and after stimulation  Comfort (Breast/Nipple): Soft / non-tender  Hold (Positioning): Assistance needed to correctly position infant at breast and maintain latch.  LATCH Score: 6   Lactation Tools Discussed/Used    Interventions Interventions: Breast feeding basics reviewed;Assisted with latch;Skin to skin;Breast massage;Hand express;Breast compression;Position options;Support pillows;Adjust position;Education  Discharge Pump: Hands Free;Personal  Consult Status Consult Status: Follow-up Date: 11/03/22 Follow-up type: In-patient    Dora Sims 11/02/2022, 6:29 PM

## 2022-11-02 NOTE — Lactation Note (Signed)
This note was copied from a baby's chart. Lactation Consultation Note  Patient Name: Nancy Page [id number] Date: 11/02/2022 Reason for consult: Initial assessment;Term;Primapara;1st time breastfeeding Age:23 hours   P1: Term infant at 39+6 weeks Feeding preference:  Breast  "[name]" was swaddled and asleep in mother's arms when I arrived.  Mother had an extensive third degree repair after birth; still feeling numb.  Breast feeding basics reviewed.  Mother familiar with hand expression stating the labor and delivery nurse demonstrated this; no drops noted.  (Will not practice at this time due to a female visitor present.)  Encouraged lots of STS, breast massage and hand expression.  Reviewed cues and suggested mother awaken "[name]" in another hour if he does not show cues.  Suggested she call her RN/LC for latch assistance as needed.  Mother had no questions at this time.  RN updated.   Maternal Data Has patient been taught Hand Expression?: Yes Does the patient have breastfeeding experience prior to this delivery?: No  Feeding Mother's Current Feeding Choice: Breast Milk  LATCH Score Latch: Repeated attempts needed to sustain latch, nipple held in mouth throughout feeding, stimulation needed to elicit sucking reflex.  Audible Swallowing: None  Type of Nipple: Flat  Comfort (Breast/Nipple): Soft / non-tender  Hold (Positioning): Full assist, staff holds infant at breast  LATCH Score: 4   Lactation Tools Discussed/Used Tools:  (will need hand pump and shells when on MBU 5 S)  Interventions Interventions: Breast feeding basics reviewed;Education;LC Services brochure  Discharge Pump: Hands Free;Personal  Consult Status Consult Status: Follow-up Date: 11/03/22 Follow-up type: In-patient    Lilianne Delair R Shareena Nusz 11/02/2022, 11:45 AM

## 2022-11-02 NOTE — Anesthesia Postprocedure Evaluation (Signed)
Anesthesia Post Note  Patient: Nancy Page  Procedure(s) Performed: AN AD HOC LABOR EPIDURAL     Patient location during evaluation: Mother Baby Anesthesia Type: Epidural Level of consciousness: awake and alert Pain management: pain level controlled Vital Signs Assessment: post-procedure vital signs reviewed and stable Respiratory status: spontaneous breathing, nonlabored ventilation and respiratory function stable Cardiovascular status: stable Postop Assessment: no headache, no backache and epidural receding Anesthetic complications: no   No notable events documented.  Last Vitals:  Vitals:   11/02/22 1117 11/02/22 1216  BP: 119/67 118/81  Pulse: (!) 111 (!) 101  Resp: 18 18  Temp: 36.8 C 36.7 C  SpO2: 100% 100%    Last Pain:  Vitals:   11/02/22 1412  TempSrc:   PainSc: 0-No pain   Pain Goal:                   Lexandra Rettke

## 2022-11-02 NOTE — Lactation Note (Signed)
This note was copied from a baby's chart. Lactation Consultation Note  Patient Name: Nancy Page [id number] Date: 11/02/2022 Reason for consult: L&D Initial assessment;Primapara;1st time breastfeeding;Breastfeeding assistance;Term;Difficult latch (2nd LC L/D visit . Family member holding baby warpped in 2 blankets. Per mom the nurse assisted, baby attempted to latch after repair. Baby opens wide, latches, but doesn't sustain latch due to areola edema. Even after reverse pressure,doesn't sustain.) Age:83 hours LC visit was delayed due to repair.  Maternal Data Has patient been taught Hand Expression?:  (attempted)  Feeding Mother's Current Feeding Choice: Breast Milk and Formula  LATCH Score Latch: Repeated attempts needed to sustain latch, nipple held in mouth throughout feeding, stimulation needed to elicit sucking reflex.  Audible Swallowing: None  Type of Nipple: Flat  Comfort (Breast/Nipple): Soft / non-tender  Hold (Positioning): Full assist, staff holds infant at breast  LATCH Score: 4   Lactation Tools Discussed/Used Tools:  (will need hand pump and shells when on MBU 5 S)  Interventions Interventions: Breast feeding basics reviewed;Assisted with latch;Skin to skin;Hand express;Reverse pressure;Breast compression;Adjust position;Support pillows;Position options;Education  Discharge    Consult Status Consult Status: Follow-up from L&D Date: 11/02/22 Follow-up type: In-patient    Nancy Page 11/02/2022, 9:57 AM

## 2022-11-02 NOTE — Anesthesia Procedure Notes (Signed)
Epidural Patient location during procedure: OB Start time: 11/02/2022 5:35 AM End time: 11/02/2022 5:45 AM  Staffing Anesthesiologist: Lucretia Kern, MD Performed: anesthesiologist   Preanesthetic Checklist Completed: patient identified, IV checked, risks and benefits discussed, monitors and equipment checked, pre-op evaluation and timeout performed  Epidural Patient position: sitting Prep: DuraPrep Patient monitoring: heart rate, continuous pulse ox and blood pressure Approach: midline Location: L3-L4 Injection technique: LOR air  Needle:  Needle type: Tuohy  Needle gauge: 17 G Needle length: 9 cm Needle insertion depth: 4 cm Catheter type: closed end flexible Catheter size: 19 Gauge Catheter at skin depth: 9 cm Test dose: negative  Assessment Events: blood not aspirated, injection not painful, no injection resistance, no paresthesia and negative IV test  Additional Notes Reason for block:procedure for pain

## 2022-11-03 DIAGNOSIS — D62 Acute posthemorrhagic anemia: Secondary | ICD-10-CM | POA: Diagnosis not present

## 2022-11-03 LAB — CBC
HCT: 24 % — ABNORMAL LOW (ref 36.0–46.0)
Hemoglobin: 7.7 g/dL — ABNORMAL LOW (ref 12.0–15.0)
MCH: 27.3 pg (ref 26.0–34.0)
MCHC: 32.1 g/dL (ref 30.0–36.0)
MCV: 85.1 fL (ref 80.0–100.0)
Platelets: 182 10*3/uL (ref 150–400)
RBC: 2.82 MIL/uL — ABNORMAL LOW (ref 3.87–5.11)
RDW: 14.9 % (ref 11.5–15.5)
WBC: 12.5 10*3/uL — ABNORMAL HIGH (ref 4.0–10.5)
nRBC: 0 % (ref 0.0–0.2)

## 2022-11-03 LAB — PREPARE RBC (CROSSMATCH)

## 2022-11-03 LAB — HEMOGLOBIN AND HEMATOCRIT, BLOOD
HCT: 31.6 % — ABNORMAL LOW (ref 36.0–46.0)
Hemoglobin: 11.1 g/dL — ABNORMAL LOW (ref 12.0–15.0)

## 2022-11-03 MED ORDER — SODIUM CHLORIDE 0.9% IV SOLUTION: Freq: Once | INTRAVENOUS | Status: AC

## 2022-11-03 NOTE — Discharge Summary (Addendum)
SVD OB Discharge Summary       Patient Name: Nancy Page DOB: Feb 15, 2001 MRN: [id number]  Date of admission: 11/01/2022 Delivering MD: Celedonio Savage  Date of delivery: 11/02/2022 Type of delivery: SVD  Newborn Data: Sex: Baby Female Circumcision: done in pt Live born female  Birth Weight: 6 lb 2.8 oz (2800 g) APGAR: 8, 9  Newborn Delivery   Birth date/time: 11/02/2022 07:12:04 Delivery type: Vaginal, Spontaneous      Feeding: breast and bottle Infant being discharge to home with mother in stable condition.   Admitting diagnosis: Normal labor [O80, Z37.9] Intrauterine pregnancy: [redacted]w[redacted]d     Secondary diagnosis:  Principal Problem:   Normal labor Active Problems:   SVD (spontaneous vaginal delivery)   Normal postpartum course   Third degree perineal laceration   Acute blood loss anemia                                Complications: None                                                              Intrapartum Procedures: spontaneous vaginal delivery Postpartum Procedures: transfusion 2units RBS r/t ABLA Complications-Operative and Postpartum:  3rd degree perineal laceration Augmentation: N/A   History of Present Illness: [name] is a 21 y.o. female, G1P1001, who presents at [redacted]w[redacted]d weeks gestation. The patient has been followed at  [facility]  Her pregnancy has been complicated by:  Patient Active Problem List   Diagnosis Date Noted   SVD (spontaneous vaginal delivery) 11/03/2022   Normal postpartum course 11/03/2022   Third degree perineal laceration 11/03/2022   Acute blood loss anemia 11/03/2022   Normal labor 11/01/2022   Preterm contractions 10/03/2022     Active Ambulatory Problems    Diagnosis Date Noted   Preterm contractions 10/03/2022   Resolved Ambulatory Problems    Diagnosis Date Noted   No Resolved Ambulatory Problems   Past Medical History:  Diagnosis Date   ANA positive    SVT  (supraventricular tachycardia)    Vitamin D deficiency      Hospital course:  Onset of Labor With Vaginal Delivery      22 y.o. yo G1P1001 at [redacted]w[redacted]d was admitted in Latent Labor on 11/01/2022. Labor course was complicated by pt was admitted on  11/26 for latent labor at term, she progressed natural and had SVD on 11/27 with a 3rd degree laceration repaired, ebl was , hgb drop of 10.9-7.7 with symptoms, and recived 2 unti RBC, 4 hour post hgb was 11.1. Pt asymptomatic now. Membrane Rupture Time/Date: 3:47 AM ,11/02/2022   Delivery Method:Vaginal, Spontaneous  Episiotomy: None  Lacerations:  3rd degree  Patient had a postpartum course complicated by 2 units RBC for ABLA with symptoms.  She is ambulating, tolerating a regular diet, passing flatus, and urinating well. Patient is discharged home in stable condition on 11/04/22.  Newborn Data: Birth date:11/02/2022  Birth time:7:12 AM  Gender:Female  Living status:Living  Apgars:8 ,9  Weight:2800 g  Postpartum Day # 2 : S/P NSVD due to see above. Patient up ad lib, denies syncope or dizziness. Reports consuming regular diet without issues and denies N/V.  Patient reports 0 bowel movement + passing flatus.  Denies issues with urination and reports bleeding is "lighter."  Patient is Br Bt feeding and reports going well.  Desires BC Pills for postpartum contraception.  Pain is being appropriately managed with use of PO meds. Pt reports increased pain in lower back and in vaginal area, labial hematoma noted, ice and sidz baths given, oxy ir for pain control, dr Alyse Low aware and will assess prior to discharge today, will repeat CBC to ensure hgb not dropping prior to discharge as well. Dr Alyse Low evaluated the pt, and is okay with discharge, she recommended augmentin 875mg  BID for 7 days, f/u with her on tuesday. HGB went up to 11.6 from 11.1. VSS.   Physical exam  Vitals:   11/03/22 1455 11/03/22 1654 11/03/22 2050 11/04/22 0512  BP: 111/82 122/78  120/76 109/75  Pulse: 81 74 75 96  Resp: 18 18 18 17   Temp: 98.4 F (36.9 C) 98.8 F (37.1 C) 98.9 F (37.2 C) 99 F (37.2 C)  TempSrc: Oral Oral Oral Oral  SpO2: 100% 99% 100% 100%  Weight:      Height:       General: alert, cooperative, and no distress Lochia: appropriate Uterine Fundus: firm Perineum: approximate, sutures intact, large hematoma dark and superficial on right labia, measure 1 inch by 2 inches, circular, first, no induration noted.  DVT Evaluation: No evidence of DVT seen on physical exam. Negative Homan's sign. No cords or calf tenderness. No significant calf/ankle edema.  Labs: Lab Results  Component Value Date   WBC 12.5 (H) 11/03/2022   HGB 11.1 (L) 11/03/2022   HCT 31.6 (L) 11/03/2022   MCV 85.1 11/03/2022   PLT 182 11/03/2022      Latest Ref Rng & Units 09/03/2022    2:07 PM  CMP  Glucose 70 - 99 mg/dL 86   BUN 6 - 20 mg/dL 7   Creatinine 11/05/2022 - 09/05/2022 mg/dL 0.34   Sodium 7.42 - 5.95 mmol/L 135   Potassium 3.5 - 5.1 mmol/L 4.1   Chloride 98 - 111 mmol/L 107   CO2 22 - 32 mmol/L 22   Calcium 8.9 - 10.3 mg/dL 9.6     Date of discharge: 11/04/2022 Discharge Diagnoses: Term Pregnancy-delivered Discharge instruction: per After Visit Summary and "Baby and Me Booklet".  After visit meds:   Activity:           unrestricted and pelvic rest Advance as tolerated. Pelvic rest for 6 weeks.  Diet:                routine Medications: PNV, Ibuprofen, Colace, Iron, and oxy IR.  Postpartum contraception: Progesterone only pills Condition:  Pt discharge to home with baby in stable 3rd degree repair: take senna, oxy IR for severe pain, 1 weeks laceration check Anemia: PO Iron.   Meds: Allergies as of 11/04/2022       Reactions   Keflex [cephalexin] Hives        Medication List     STOP taking these medications    cyclobenzaprine 10 MG tablet Commonly known as: FLEXERIL   docusate sodium 100 MG capsule Commonly known as: Colace       TAKE  these medications    ferrous sulfate 325 (65 FE) MG tablet Take 1 tablet (325 mg total) by mouth 2 (two) times daily with a meal.   ibuprofen 600 MG tablet Commonly known as: ADVIL Take 1 tablet (600 mg total) by mouth every  6 (six) hours.   oxyCODONE 5 MG immediate release tablet Commonly known as: Oxy IR/ROXICODONE Take 1 tablet (5 mg total) by mouth every 4 (four) hours as needed (pain scale 4-7).   PRENATAL VITAMIN PO Take 1 tablet by mouth in the morning.   senna-docusate 8.6-50 MG tablet Commonly known as: Senokot-S Take 2 tablets by mouth daily. Start taking on: November 05, 2022        Discharge Follow Up:   Follow-up Information     [facility]. Schedule an appointment as soon as possible for a visit in 1 week(s).   Specialty: Obstetrics and Gynecology Why: 1 week 3rd degree laceration check, and 6 weeks PPV. Contact information: [address] [place] [address] [phone]                 [facility] CNM, FNP-C, PMHNP-BC  [id number]  [place], [place] [address]  Cell: [phone]  Office Phone: [phone] Fax: [phone] 11/04/2022  11:42 AM

## 2022-11-03 NOTE — Progress Notes (Addendum)
Post Partum Day 1 Subjective: up ad lib and voiding. Patient reports feeling very lightheaded and out of breath when she moves. She also reports feeling tired. She has significant perineal pain which improves with pain medication use.   Objective: Blood pressure 116/68, pulse 84, temperature 98.4 F (36.9 C), temperature source Oral, resp. rate 18, height 5\' 1"  (1.549 m), weight 66.9 kg, last menstrual period 01/27/2022, SpO2 99 %, unknown if currently breastfeeding.    11/03/2022   11:14 AM 11/03/2022    6:04 AM 11/02/2022    8:50 PM  Vitals with BMI  Systolic [id number]  Diastolic 64 68 69  Pulse 83 84 78   Physical Exam:  General: alert, cooperative, fatigued, and moderate distress while moving in bed complaining of perineum pain Lochia: appropriate Uterine Fundus: firm Perineum: Laceration repair intact, with edema and swelling.  DVT Evaluation: No evidence of DVT seen on physical exam No significant calf/ankle edema.  Recent Labs    11/01/22 2144 11/03/22 0516  HGB 10.9* 7.7*  HCT 32.7* 24.0*   CBC    Component Value Date/Time   WBC 12.5 (H) 11/03/2022 0516   RBC 2.82 (L) 11/03/2022 0516   HGB 7.7 (L) 11/03/2022 0516   HGB 13.6 10/24/2021 1553   HCT 24.0 (L) 11/03/2022 0516   HCT 41.0 10/24/2021 1553   PLT 182 11/03/2022 0516   PLT 358 10/24/2021 1553   MCV 85.1 11/03/2022 0516   MCV 91 10/24/2021 1553   MCH 27.3 11/03/2022 0516   MCHC 32.1 11/03/2022 0516   RDW 14.9 11/03/2022 0516   RDW 12.8 10/24/2021 1553   LYMPHSABS 1.7 09/03/2022 1407   LYMPHSABS 2.8 10/24/2021 1553   MONOABS 0.8 09/03/2022 1407   EOSABS 0.0 09/03/2022 1407   EOSABS 0.0 10/24/2021 1553   BASOSABS 0.0 09/03/2022 1407   BASOSABS 0.0 10/24/2021 1553     Current Facility-Administered Medications:    acetaminophen (TYLENOL) tablet 650 mg, 650 mg, Oral, Q4H PRN, Dillard, Naima, MD, 650 mg at 11/03/22 0825   benzocaine-Menthol (DERMOPLAST) 20-0.5 % topical spray 1 Application, 1  Application, Topical, PRN, Dillard, Naima, MD, 1 Application at [id number]   coconut oil, 1 Application, Topical, PRN, Dillard, Naima, MD   witch hazel-glycerin (TUCKS) pad 1 Application, 1 Application, Topical, PRN, 1 Application at [id number] **AND** dibucaine (NUPERCAINAL) 1 % rectal ointment 1 Application, 1 Application, Rectal, PRN, Dillard, Naima, MD, 1 Application at [id number]   diphenhydrAMINE (BENADRYL) capsule 25 mg, 25 mg, Oral, Q6H PRN, Dillard, Naima, MD   docusate sodium (COLACE) capsule 100 mg, 100 mg, Oral, BID, Dillard, Naima, MD, 100 mg at 11/03/22 0825   ibuprofen (ADVIL) tablet 600 mg, 600 mg, Oral, Q6H, Dillard, Naima, MD, 600 mg at 11/03/22 1109   ondansetron (ZOFRAN) tablet 4 mg, 4 mg, Oral, Q4H PRN **OR** ondansetron (ZOFRAN) injection 4 mg, 4 mg, Intravenous, Q4H PRN, Dillard, Naima, MD   oxyCODONE (Oxy IR/ROXICODONE) immediate release tablet 10 mg, 10 mg, Oral, Q4H PRN, Dillard, Naima, MD   oxyCODONE (Oxy IR/ROXICODONE) immediate release tablet 5 mg, 5 mg, Oral, Q4H PRN, Dillard, Naima, MD, 5 mg at 11/03/22 0606   prenatal multivitamin tablet 1 tablet, 1 tablet, Oral, Q1200, Dillard, Naima, MD, 1 tablet at 11/03/22 1109   senna-docusate (Senokot-S) tablet 2 tablet, 2 tablet, Oral, Daily, Dillard, Naima, MD, 2 tablet at 11/03/22 0825   simethicone (MYLICON) chewable tablet 80 mg, 80 mg, Oral, PRN, Dillard, Naima, MD   Tdap (BOOSTRIX) injection 0.5  mL, 0.5 mL, Intramuscular, Once, Dillard, Naima, MD   zolpidem (AMBIEN) tablet 5 mg, 5 mg, Oral, QHS PRN, Dillard, Naima, MD   Assessment/Plan: 21 y/o Para 1, PPD # 1 after a vaginal delivery, with third degree laceration repair, with symptomatic anemia,  - Continue with stool softeners, may do sitz baths daily.   - Management options of symptomatic anemia discussed. She desires a blood transfusion.  I discussed with patient risks, benefits and alternatives of blood transfusion including risks that include but not  limited to of infection (through viruses, bacteria and parasites in donor blood), allergic, hemolytic and other immunologic transfusion reactions.  She expressed understanding of all this and desires to proceed with the transfusion.  Will give 2 units of blood and check a CBC 4 hours after transfusion.  - Patient desires circumcision of her baby boy and this was done after obtaining consent.  - Plan for discharge tomorrow, Breastfeeding, Circumcision prior to discharge, and Contraception Birth control pills. - Patient reports she has been able to tolerate wheat and milk products recently and does not have the allergy any more, she requested these allergies on record be removed and this was done.   LOS: 2 days   Prescilla Sours, MD 11/03/2022, 11:06 AM

## 2022-11-03 NOTE — Lactation Note (Signed)
This note was copied from a baby's chart. Lactation Consultation Note  Patient Name: Nancy Page Date: 11/03/2022 Reason for consult: Follow-up assessment;Primapara;1st time breastfeeding;Term Age:23 hours  LC in to visit with [name] of term baby delivered vaginally.  Mom reports baby has latched for a few minutes before falling asleep.  Mom reports baby sucking on nipple and pulling in with his cheeks when sucking, Mom reports latch is painful.  Baby's last feeding was a 2.5 ml spoon feed at 0420.  Mom has been trying.  LC attempted to help baby latch.  Placed baby STS across Mom's breasts.  Mom assisted to hand express a drop of colostrum.  No cueing, no rooting by baby.  On digital suck assessment, baby's palate noted to be highly arched.  Unable to assess oral structure as baby would not open his mouth.  LC set up DEBP and assisted with first pumping using 24 mm flanges.  Mom expressed some drops that she tried to finger feed, but baby would not open his mouth.  Discussed need to supplement baby, volume guidelines provided by RN.  Mom offered donor breast milk or formula, Mom choosing formula.  LC demonstrated paced bottle feeding.  Baby wouldn't open his mouth and kept his gums tight.  It took 15 mins of coaxing to get baby to open and receive the nipple.  He sucked slowly and gagged once.  He took 10 ml Similac 360.  Placed baby STS on Mom's chest.  Baby sound asleep.  Plan recommended- 1- Keep baby STS as much as possible 2- Watch for cues, and offer the breast (ask for help prn) 3-If baby does not latch to the breast, Mom will double pump on initiation setting and baby will be supplemented with EBM+/formula per volume guidelines.   If baby continues to have difficult, possible SLP consult. Mom to call prn for assistance.  Feeding Nipple Type: Slow - flow  LATCH Score Latch: Too sleepy or reluctant, no latch achieved, no sucking elicited.  Audible  Swallowing: None  Type of Nipple: Everted at rest and after stimulation  Comfort (Breast/Nipple): Soft / non-tender  Hold (Positioning): Full assist, staff holds infant at breast  LATCH Score: 4   Lactation Tools Discussed/Used Tools: Pump;Flanges;Bottle Flange Size: 24 Breast pump type: Double-Electric Breast Pump Pump Education: Setup, frequency, and cleaning;Milk Storage Reason for Pumping: support milk supply/sleepy baby not latching Pumping frequency: Encouraged to pump both breasts 15 mins if baby is not latching to the breast Pumped volume: 0 mL (drops)  Interventions Interventions: Breast feeding basics reviewed;Assisted with latch;Skin to skin;Breast massage;Hand express;Breast compression;Adjust position;Support pillows;Position options;Expressed milk;DEBP;Hand pump;Pace feeding  Discharge Pump: Personal;DEBP (Medela)  Consult Status Consult Status: Follow-up Date: 11/04/22 Follow-up type: In-patient    Nancy Page 11/03/2022, 12:37 PM

## 2022-11-04 LAB — BPAM RBC
Blood Product Expiration Date: 202312222359
Blood Product Expiration Date: 202312222359
ISSUE DATE / TIME: 202311281121
ISSUE DATE / TIME: 202311281435
Unit Type and Rh: 7300
Unit Type and Rh: 7300

## 2022-11-04 LAB — TYPE AND SCREEN
ABO/RH(D): B POS
Antibody Screen: NEGATIVE
Unit division: 0
Unit division: 0

## 2022-11-04 LAB — HEMOGLOBIN AND HEMATOCRIT, BLOOD
HCT: 33 % — ABNORMAL LOW (ref 36.0–46.0)
Hemoglobin: 11.6 g/dL — ABNORMAL LOW (ref 12.0–15.0)

## 2022-11-04 MED ORDER — OXYCODONE HCL 5 MG PO TABS: 5.0000 mg | ORAL_TABLET | ORAL | 0 refills | Status: DC | PRN

## 2022-11-04 MED ORDER — IBUPROFEN 600 MG PO TABS: 600.0000 mg | ORAL_TABLET | Freq: Four times a day (QID) | ORAL | 0 refills | Status: DC

## 2022-11-04 MED ORDER — AMOXICILLIN-POT CLAVULANATE 875-125 MG PO TABS: 1.0000 | ORAL_TABLET | Freq: Two times a day (BID) | ORAL | 0 refills | Status: DC

## 2022-11-04 MED ORDER — AMOXICILLIN-POT CLAVULANATE 875-125 MG PO TABS
1.0000 | ORAL_TABLET | Freq: Two times a day (BID) | ORAL | Status: DC
  Filled 2022-11-04 (×2): qty 1

## 2022-11-04 MED ORDER — SENNOSIDES-DOCUSATE SODIUM 8.6-50 MG PO TABS: 2.0000 | ORAL_TABLET | Freq: Every day | ORAL | 0 refills | Status: AC

## 2022-11-04 NOTE — Lactation Note (Signed)
This note was copied from a baby's chart. Lactation Consultation Note  Patient Name: Nancy Page Date: 11/04/2022 Reason for consult: Follow-up assessment;Difficult latch;Primapara;1st time breastfeeding;Term;Breastfeeding assistance;Infant weight loss (0.18% WL) Age:23 hours  LC entered the room and the infant was laying next to the birth parent.  Per the birth parent the infant has been doing much better with breastfeeding and she feels more confident about breastfeeding.  She stated that she has not been doing much pumping, but she has been putting the infant to the breast more often.  She said that the infant has been staying on the breast for about 10 min during every feed.  LC praised the birth parent for her efforts and encouraged her to continue to work on latching the infant.  She is aware that the goal is to have the infant feed for at least 15 min at the breast.  LC suggested that the birth parent pump a few times today since the infant is staying on for 10 min.  She stated that she had no further questions or concerns.  LC left her name on the board and exited the room.  The birth parent will call RN/LC for assistance with breastfeeding.   Feeding Mother's Current Feeding Choice: Breast Milk and Formula Nipple Type: Slow - flow  Interventions Interventions: Education  Consult Status Consult Status: Follow-up Date: 11/05/22 Follow-up type: In-patient   Nancy Page 11/04/2022, 1:22 PM

## 2022-11-09 ENCOUNTER — Telehealth (HOSPITAL_COMMUNITY): Payer: Self-pay

## 2022-11-09 NOTE — Telephone Encounter (Signed)
Patient did not answer phone call. Voicemail left for patient.   Nancy Page [facility] 11/09/2022,1826

## 2022-11-10 DIAGNOSIS — Z6827 Body mass index (BMI) 27.0-27.9, adult: Secondary | ICD-10-CM | POA: Diagnosis not present

## 2022-11-10 DIAGNOSIS — Z87828 Personal history of other (healed) physical injury and trauma: Secondary | ICD-10-CM | POA: Diagnosis not present

## 2022-11-10 NOTE — Progress Notes (Deleted)
Office Visit Note  Patient: Nancy Page             Date of Birth: Jan 09, 2001           MRN: [id number]             PCP: Arnette Felts, FNP Referring: Arnette Felts, FNP Visit Date: 11/24/2022 Occupation: @GUAROCC @  Subjective:  No chief complaint on file.   History of Present Illness: Nancy Page is a 21 y.o. female ***   Activities of Daily Living:  Patient reports morning stiffness for *** {minute/hour:19697}.   Patient {ACTIONS;DENIES/REPORTS:21021675::"Denies"} nocturnal pain.  Difficulty dressing/grooming: {ACTIONS;DENIES/REPORTS:21021675::"Denies"} Difficulty climbing stairs: {ACTIONS;DENIES/REPORTS:21021675::"Denies"} Difficulty getting out of chair: {ACTIONS;DENIES/REPORTS:21021675::"Denies"} Difficulty using hands for taps, buttons, cutlery, and/or writing: {ACTIONS;DENIES/REPORTS:21021675::"Denies"}  No Rheumatology ROS completed.   PMFS History:  Patient Active Problem List   Diagnosis Date Noted   SVD (spontaneous vaginal delivery) 11/03/2022   Normal postpartum course 11/03/2022   Third degree perineal laceration 11/03/2022   Acute blood loss anemia 11/03/2022   Normal labor 11/01/2022   Preterm contractions 10/03/2022    Past Medical History:  Diagnosis Date   ANA positive    SVT (supraventricular tachycardia)    Vitamin D deficiency     Family History  Problem Relation Age of Onset   Hypertension Mother    Lupus Father    Heart disease Brother    Heart murmur Brother    Diabetes Maternal Aunt    Diabetes Maternal Grandmother    Stroke Maternal Grandfather    Cancer Paternal Grandmother    Asthma Neg Hx    Birth defects Neg Hx    Past Surgical History:  Procedure Laterality Date   NO PAST SURGERIES     Social History   Social History Narrative   Not on file   There is no immunization history for the selected administration types on file for this patient.   Objective: Vital Signs: LMP 01/27/2022    Physical Exam    Musculoskeletal Exam: ***  CDAI Exam: CDAI Score: -- Patient Global: --; Provider Global: -- Swollen: --; Tender: -- Joint Exam 11/24/2022   No joint exam has been documented for this visit   There is currently no information documented on the homunculus. Go to the Rheumatology activity and complete the homunculus joint exam.  Investigation: No additional findings.  Imaging: No results found.  Recent Labs: Lab Results  Component Value Date   WBC 12.5 (H) 11/03/2022   HGB 11.6 (L) 11/04/2022   PLT 182 11/03/2022   NA 135 09/03/2022   K 4.1 09/03/2022   CL 107 09/03/2022   CO2 22 09/03/2022   GLUCOSE 86 09/03/2022   BUN 7 09/03/2022   CREATININE 0.64 09/03/2022   BILITOT 0.2 04/29/2022   AST 14 04/29/2022   ALT 13 04/29/2022   PROT 6.9 04/29/2022   CALCIUM 9.6 09/03/2022    Speciality Comments: No specialty comments available.  Procedures:  No procedures performed Allergies: Patient has no active allergies.   Assessment / Plan:     Visit Diagnoses: No diagnosis found.  Orders: No orders of the defined types were placed in this encounter.  No orders of the defined types were placed in this encounter.   Face-to-face time spent with patient was *** minutes. Greater than 50% of time was spent in counseling and coordination of care.  Follow-Up Instructions: No follow-ups on file.   09/05/2022, CMA  Note - This record has been created using Ellen Henri  software.  Chart creation errors have been sought, but may not always  have been located. Such creation errors do not reflect on  the standard of medical care.  

## 2022-11-24 ENCOUNTER — Ambulatory Visit: Payer: BC Managed Care – PPO | Attending: Rheumatology | Admitting: Rheumatology

## 2022-11-24 DIAGNOSIS — R768 Other specified abnormal immunological findings in serum: Secondary | ICD-10-CM

## 2022-11-24 DIAGNOSIS — J302 Other seasonal allergic rhinitis: Secondary | ICD-10-CM

## 2022-11-24 DIAGNOSIS — G8929 Other chronic pain: Secondary | ICD-10-CM

## 2022-11-24 DIAGNOSIS — Z3A16 16 weeks gestation of pregnancy: Secondary | ICD-10-CM

## 2022-11-24 DIAGNOSIS — R5383 Other fatigue: Secondary | ICD-10-CM

## 2022-11-24 DIAGNOSIS — M791 Myalgia, unspecified site: Secondary | ICD-10-CM

## 2022-11-24 DIAGNOSIS — E559 Vitamin D deficiency, unspecified: Secondary | ICD-10-CM

## 2022-11-24 DIAGNOSIS — R4189 Other symptoms and signs involving cognitive functions and awareness: Secondary | ICD-10-CM

## 2022-12-14 ENCOUNTER — Ambulatory Visit (INDEPENDENT_AMBULATORY_CARE_PROVIDER_SITE_OTHER): Payer: BC Managed Care – PPO | Admitting: Nurse Practitioner

## 2022-12-14 ENCOUNTER — Encounter: Payer: Self-pay | Admitting: Nurse Practitioner

## 2022-12-14 VITALS — BP 116/60 | Temp 98.5°F | Ht 61.0 in | Wt 126.0 lb

## 2022-12-14 DIAGNOSIS — Z87898 Personal history of other specified conditions: Secondary | ICD-10-CM

## 2022-12-14 DIAGNOSIS — R42 Dizziness and giddiness: Secondary | ICD-10-CM

## 2022-12-14 NOTE — Patient Instructions (Signed)
Dr .[facility] Address: [address], [place],  [address] Phone: [phone]

## 2022-12-14 NOTE — Progress Notes (Signed)
I,Tianna Badgett,acting as a Neurosurgeon for [employer], FNP.,have documented all relevant documentation on the behalf of Arnette Felts, FNP,as directed by  Arnette Felts, FNP while in the presence of Arnette Felts, FNP.  Subjective:     Patient ID: Nancy Page , female    DOB: 12-Sep-2001 , 22 y.o.   MRN: [id number]   No chief complaint on file.   HPI  Patient presents today for follow up. She had symptoms of facial rash and fever after having her baby. She is pumping for breast milk. She did miss her appt with Dr. Loni Beckwith in December  She is due to see her OB tomorrow for her 6 week f/u. She had to have a blood transfusion after delivery. She also reports having vaginal tear.     Past Medical History:  Diagnosis Date   ANA positive    SVT (supraventricular tachycardia)    Vitamin D deficiency      Family History  Problem Relation Age of Onset   Hypertension Mother    Lupus Father    Heart disease Brother    Heart murmur Brother    Diabetes Maternal Aunt    Diabetes Maternal Grandmother    Stroke Maternal Grandfather    Cancer Paternal Grandmother    Asthma Neg Hx    Birth defects Neg Hx      Current Outpatient Medications:    ferrous sulfate 325 (65 FE) MG tablet, Take 1 tablet (325 mg total) by mouth 2 (two) times daily with a meal., Disp: 60 tablet, Rfl: 3   ibuprofen (ADVIL) 600 MG tablet, Take 1 tablet (600 mg total) by mouth every 6 (six) hours., Disp: 30 tablet, Rfl: 0   oxyCODONE (OXY IR/ROXICODONE) 5 MG immediate release tablet, Take 1 tablet (5 mg total) by mouth every 4 (four) hours as needed (pain scale 4-7)., Disp: 20 tablet, Rfl: 0   Prenatal Vit-Fe Fumarate-FA (PRENATAL VITAMIN PO), Take 1 tablet by mouth in the morning., Disp: , Rfl:    senna-docusate (SENOKOT-S) 8.6-50 MG tablet, Take 2 tablets by mouth daily., Disp: 30 tablet, Rfl: 0   No Active Allergies   Review of Systems  Constitutional: Negative.   Respiratory: Negative.    Cardiovascular:  Negative.   Gastrointestinal: Negative.   Neurological: Negative.      Today's Vitals   12/14/22 1523  BP: 116/60  Temp: 98.5 F (36.9 C)  TempSrc: Oral  Weight: 126 lb (57.2 kg)  Height: 5\' 1"  (1.549 m)   Body mass index is 23.81 kg/m.   Objective:  Physical Exam Vitals reviewed.  Constitutional:      General: She is not in acute distress.    Appearance: Normal appearance.  Cardiovascular:     Rate and Rhythm: Normal rate and regular rhythm.     Pulses: Normal pulses.     Heart sounds: Normal heart sounds. No murmur heard. Pulmonary:     Effort: Pulmonary effort is normal. No respiratory distress.     Breath sounds: Normal breath sounds. No wheezing.  Skin:    Capillary Refill: Capillary refill takes less than 2 seconds.  Neurological:     General: No focal deficit present.     Mental Status: She is alert and oriented to person, place, and time.     Cranial Nerves: No cranial nerve deficit.     Motor: No weakness.         Assessment And Plan:     1. Lightheadedness Comments: She is doing  better today.  Encouraged to stay well-hydrated with at least 64 ounces of water a day. - Hemoglobin A1c - CBC - BMP8+eGFR  2. History of blood loss Comments: She had a blood transfusion after delivery.  Will recheck hemoglobin level - CBC     Patient was given opportunity to ask questions. Patient verbalized understanding of the plan and was able to repeat key elements of the plan. All questions were answered to their satisfaction.  Minette Brine, FNP   I, Minette Brine, FNP, have reviewed all documentation for this visit. The documentation on 12/14/22 for the exam, diagnosis, procedures, and orders are all accurate and complete.   IF YOU HAVE BEEN REFERRED TO A SPECIALIST, IT MAY TAKE 1-2 WEEKS TO SCHEDULE/PROCESS THE REFERRAL. IF YOU HAVE NOT HEARD FROM US/SPECIALIST IN TWO WEEKS, PLEASE GIVE [place] A CALL AT [phone].   THE PATIENT IS ENCOURAGED TO PRACTICE  SOCIAL DISTANCING DUE TO THE COVID-19 PANDEMIC.

## 2022-12-15 LAB — CBC
Hematocrit: 39.7 % (ref 34.0–46.6)
Hemoglobin: 13.1 g/dL (ref 11.1–15.9)
MCH: 28.1 pg (ref 26.6–33.0)
MCHC: 33 g/dL (ref 31.5–35.7)
MCV: 85 fL (ref 79–97)
Platelets: 395 10*3/uL (ref 150–450)
RBC: 4.67 x10E6/uL (ref 3.77–5.28)
RDW: 15.5 % — ABNORMAL HIGH (ref 11.7–15.4)
WBC: 6.4 10*3/uL (ref 3.4–10.8)

## 2022-12-15 LAB — BMP8+EGFR
BUN/Creatinine Ratio: 13 (ref 9–23)
BUN: 11 mg/dL (ref 6–20)
CO2: 23 mmol/L (ref 20–29)
Calcium: 10.1 mg/dL (ref 8.7–10.2)
Chloride: 100 mmol/L (ref 96–106)
Creatinine, Ser: 0.83 mg/dL (ref 0.57–1.00)
Glucose: 78 mg/dL (ref 70–99)
Potassium: 4.8 mmol/L (ref 3.5–5.2)
Sodium: 138 mmol/L (ref 134–144)
eGFR: 102 mL/min/{1.73_m2} (ref >59)

## 2022-12-15 LAB — HEMOGLOBIN A1C
Est. average glucose Bld gHb Est-mCnc: 111 mg/dL
Hgb A1c MFr Bld: 5.5 % (ref 4.8–5.6)

## 2022-12-22 DIAGNOSIS — E559 Vitamin D deficiency, unspecified: Secondary | ICD-10-CM | POA: Diagnosis not present

## 2022-12-22 DIAGNOSIS — Z3040 Encounter for surveillance of contraceptives, unspecified: Secondary | ICD-10-CM | POA: Diagnosis not present

## 2023-02-22 DIAGNOSIS — M791 Myalgia, unspecified site: Secondary | ICD-10-CM | POA: Diagnosis not present

## 2023-02-22 DIAGNOSIS — Z20822 Contact with and (suspected) exposure to covid-19: Secondary | ICD-10-CM | POA: Diagnosis not present

## 2023-02-22 DIAGNOSIS — R519 Headache, unspecified: Secondary | ICD-10-CM | POA: Diagnosis not present

## 2023-02-22 DIAGNOSIS — R6883 Chills (without fever): Secondary | ICD-10-CM | POA: Diagnosis not present

## 2023-02-22 DIAGNOSIS — R5383 Other fatigue: Secondary | ICD-10-CM | POA: Diagnosis not present

## 2023-02-25 ENCOUNTER — Encounter: Payer: Self-pay | Admitting: Nurse Practitioner

## 2023-03-08 DIAGNOSIS — B9689 Other specified bacterial agents as the cause of diseases classified elsewhere: Secondary | ICD-10-CM | POA: Diagnosis not present

## 2023-03-08 DIAGNOSIS — R300 Dysuria: Secondary | ICD-10-CM | POA: Diagnosis not present

## 2023-03-08 DIAGNOSIS — N3001 Acute cystitis with hematuria: Secondary | ICD-10-CM | POA: Diagnosis not present

## 2023-03-08 DIAGNOSIS — N760 Acute vaginitis: Secondary | ICD-10-CM | POA: Diagnosis not present

## 2023-03-12 NOTE — Progress Notes (Deleted)
Office Visit Note  Patient: Nancy Page             Date of Birth: 2001/05/02           MRN: [id number]             PCP: Arnette Felts, FNP Referring: Arnette Felts, FNP Visit Date: 03/25/2023 Occupation: @GUAROCC @  Subjective:  No chief complaint on file.   History of Present Illness: Nancy Page is a 22 y.o. female ***     Activities of Daily Living:  Patient reports morning stiffness for *** {minute/hour:19697}.   Patient {ACTIONS;DENIES/REPORTS:21021675::"Denies"} nocturnal pain.  Difficulty dressing/grooming: {ACTIONS;DENIES/REPORTS:21021675::"Denies"} Difficulty climbing stairs: {ACTIONS;DENIES/REPORTS:21021675::"Denies"} Difficulty getting out of chair: {ACTIONS;DENIES/REPORTS:21021675::"Denies"} Difficulty using hands for taps, buttons, cutlery, and/or writing: {ACTIONS;DENIES/REPORTS:21021675::"Denies"}  No Rheumatology ROS completed.   PMFS History:  Patient Active Problem List   Diagnosis Date Noted   SVD (spontaneous vaginal delivery) 11/03/2022   Normal postpartum course 11/03/2022   Third degree perineal laceration 11/03/2022   Acute blood loss anemia 11/03/2022   Normal labor 11/01/2022   Preterm contractions 10/03/2022    Past Medical History:  Diagnosis Date   ANA positive    SVT (supraventricular tachycardia)    Vitamin D deficiency     Family History  Problem Relation Age of Onset   Hypertension Mother    Lupus Father    Heart disease Brother    Heart murmur Brother    Diabetes Maternal Aunt    Diabetes Maternal Grandmother    Stroke Maternal Grandfather    Cancer Paternal Grandmother    Asthma Neg Hx    Birth defects Neg Hx    Past Surgical History:  Procedure Laterality Date   NO PAST SURGERIES     Social History   Social History Narrative   Not on file   There is no immunization history for the selected administration types on file for this patient.   Objective: Vital Signs: There were no vitals taken for this visit.    Physical Exam   Musculoskeletal Exam: ***  CDAI Exam: CDAI Score: -- Patient Global: --; Provider Global: -- Swollen: --; Tender: -- Joint Exam 03/25/2023   No joint exam has been documented for this visit   There is currently no information documented on the homunculus. Go to the Rheumatology activity and complete the homunculus joint exam.  Investigation: No additional findings.  Imaging: No results found.  Recent Labs: Lab Results  Component Value Date   WBC 6.4 12/14/2022   HGB 13.1 12/14/2022   PLT 395 12/14/2022   NA 138 12/14/2022   K 4.8 12/14/2022   CL 100 12/14/2022   CO2 23 12/14/2022   GLUCOSE 78 12/14/2022   BUN 11 12/14/2022   CREATININE 0.83 12/14/2022   BILITOT 0.2 04/29/2022   AST 14 04/29/2022   ALT 13 04/29/2022   PROT 6.9 04/29/2022   CALCIUM 10.1 12/14/2022    Speciality Comments: No specialty comments available.  Procedures:  No procedures performed Allergies: Patient has no active allergies.   Assessment / Plan:     Visit Diagnoses: Positive ANA (antinuclear antibody)  Myalgia  Other fatigue  Vitamin D deficiency  Brain fog  Seasonal allergies  Orders: No orders of the defined types were placed in this encounter.  No orders of the defined types were placed in this encounter.   Face-to-face time spent with patient was *** minutes. Greater than 50% of time was spent in counseling and coordination of care.  Follow-Up  Instructions: No follow-ups on file.   Ofilia Neas, PA-C  Note - This record has been created using Dragon software.  Chart creation errors have been sought, but may not always  have been located. Such creation errors do not reflect on  the standard of medical care.

## 2023-03-23 ENCOUNTER — Ambulatory Visit (INDEPENDENT_AMBULATORY_CARE_PROVIDER_SITE_OTHER): Payer: BC Managed Care – PPO

## 2023-03-23 VITALS — BP 120/60 | HR 80 | Temp 98.5°F | Ht 61.0 in | Wt 126.0 lb

## 2023-03-23 DIAGNOSIS — Z111 Encounter for screening for respiratory tuberculosis: Secondary | ICD-10-CM | POA: Diagnosis not present

## 2023-03-23 DIAGNOSIS — Z23 Encounter for immunization: Secondary | ICD-10-CM

## 2023-03-23 NOTE — Progress Notes (Signed)
Patient presents today for a covid vaccine and a TB test. Patient waited her time and states feeling okay.

## 2023-03-23 NOTE — Patient Instructions (Signed)
SARS-CoV-2 Virus (COVID-19) Vaccine Injection (Pfizer-BioNTech) What is this medication? COVID-19 VACCINE (koh-vid 19 vak SEEN) reduces the risk of COVID-19. It does not treat COVID-19. It is still possible to get COVID-19 after receiving this vaccine, but the symptoms may be less severe or not last as long. It works by helping your immune system learn how to fight off a future infection. This medicine may be used for other purposes; ask your health care provider or pharmacist if you have questions. COMMON BRAND NAME(S): COMIRNATY COVID-19, Pfizer-BioNTech COVID-19, Pfizer-BioNTech COVID-19 Bivalent What should I tell my care team before I take this medication? They need to know if you have any of these conditions: Any allergies Bleeding disorder Fever or infection Immune system problems Recent or upcoming vaccine including previous COVID-19 vaccine An unusual or allergic reaction to COVID-19 vaccine, other medications, foods, dyes, or preservatives Pregnant or trying to get pregnant Breast-feeding How should I use this medication? This vaccine is injected into a muscle. It is given by your care team. A copy of the Fact Sheet for Recipients and Caregivers will be given before each vaccination. Be sure to read this information carefully each time. This sheet may change frequently. Talk to your care team about the use of this vaccine in children. While it may be given to children as young as 6 months for selected conditions, precautions do apply. Overdosage: If you think you have taken too much of this medicine contact a poison control center or emergency room at once. NOTE: This medicine is only for you. Do not share this medicine with others. What if I miss a dose? It is important not to miss your dose. Call your care team if you are unable to keep an appointment. What may interact with this medication? This medication may interact with the following: Certain medications that thin your  blood Chemotherapy or radiation therapy Medications that lower your chance of fighting infection Steroid medications, such as prednisone or cortisone This list may not describe all possible interactions. Give your health care provider a list of all the medicines, herbs, non-prescription drugs, or dietary supplements you use. Also tell them if you smoke, drink alcohol, or use illegal drugs. Some items may interact with your medicine. What should I watch for while using this medication? Visit your care team regularly. Heart muscle inflammation has been reported after receiving this vaccine. It is not known whether the vaccine causes the heart inflammation. Talk to your care team right away if you have unusual weakness or fatigue, shortness of breath, chest pain, fast or irregular heartbeat, dizziness, or swelling of the ankles, feet, or hands. The Centers for Disease Control (CDC) is monitoring these reports to see if there is any relationship to this vaccine. This vaccine, like all vaccines, may not fully protect everyone. Continue to follow all guidelines to prevent exposure. What side effects may I notice from receiving this medication? Side effects that you should report to your care team as soon as possible: Allergic reactions--skin rash, itching, hives, swelling of the face, lips, tongue, or throat Heart muscle inflammation--unusual weakness or fatigue, shortness of breath, chest pain, fast or irregular heartbeat, dizziness, swelling of the ankles, feet, or hands Side effects that usually do not require medical attention (report these to your care team if they continue or are bothersome): Chills Fatigue Fever Headache Joint pain Muscle pain Nausea Pain, redness, or irritation at injection site Swollen lymph nodes Vomiting This list may not describe all possible side effects. Call your   doctor for medical advice about side effects. You may report side effects to FDA at  1-800-FDA-1088. Where should I keep my medication? This vaccine is only given by your care team. It will not be stored at home. NOTE: This sheet is a summary. It may not cover all possible information. If you have questions about this medicine, talk to your doctor, pharmacist, or health care provider.  2023 Elsevier/Gold Standard (2021-06-27 00:00:00)  

## 2023-03-25 ENCOUNTER — Ambulatory Visit: Payer: BC Managed Care – PPO | Admitting: Rheumatology

## 2023-03-25 DIAGNOSIS — M791 Myalgia, unspecified site: Secondary | ICD-10-CM

## 2023-03-25 DIAGNOSIS — J302 Other seasonal allergic rhinitis: Secondary | ICD-10-CM

## 2023-03-25 DIAGNOSIS — R4189 Other symptoms and signs involving cognitive functions and awareness: Secondary | ICD-10-CM

## 2023-03-25 DIAGNOSIS — R768 Other specified abnormal immunological findings in serum: Secondary | ICD-10-CM

## 2023-03-25 DIAGNOSIS — R5383 Other fatigue: Secondary | ICD-10-CM

## 2023-03-25 DIAGNOSIS — E559 Vitamin D deficiency, unspecified: Secondary | ICD-10-CM

## 2023-03-25 LAB — QUANTIFERON-TB GOLD PLUS
QuantiFERON Mitogen Value: >10 IU/mL
QuantiFERON Nil Value: 0.24 IU/mL
QuantiFERON TB1 Ag Value: 0.33 IU/mL
QuantiFERON TB2 Ag Value: 0.23 IU/mL
QuantiFERON-TB Gold Plus: NEGATIVE

## 2023-04-13 DIAGNOSIS — N3001 Acute cystitis with hematuria: Secondary | ICD-10-CM | POA: Diagnosis not present

## 2023-04-13 NOTE — Progress Notes (Deleted)
Hershal Coria Manny Vitolo,acting as a Neurosurgeon for Arnette Felts, FNP.,have documented all relevant documentation on the behalf of Arnette Felts, FNP,as directed by  Arnette Felts, FNP while in the presence of Arnette Felts, FNP.   Subjective:     Patient ID: Nancy Page , female    DOB: [birth year] , 22 y.o.   MRN: [id number]   No chief complaint on file.   HPI  Patient presents today for a HM, patient reports compliance with medications and has no other concerns at this time.     Past Medical History:  Diagnosis Date  . ANA positive   . SVT (supraventricular tachycardia)   . Vitamin D deficiency      Family History  Problem Relation Age of Onset  . Hypertension Mother   . Lupus Father   . Heart disease Brother   . Heart murmur Brother   . Diabetes Maternal Aunt   . Diabetes Maternal Grandmother   . Stroke Maternal Grandfather   . Cancer Paternal Grandmother   . Asthma Neg Hx   . Birth defects Neg Hx      Current Outpatient Medications:  .  ferrous sulfate 325 (65 FE) MG tablet, Take 1 tablet (325 mg total) by mouth 2 (two) times daily with a meal., Disp: 60 tablet, Rfl: 3 .  ibuprofen (ADVIL) 600 MG tablet, Take 1 tablet (600 mg total) by mouth every 6 (six) hours., Disp: 30 tablet, Rfl: 0 .  oxyCODONE (OXY IR/ROXICODONE) 5 MG immediate release tablet, Take 1 tablet (5 mg total) by mouth every 4 (four) hours as needed (pain scale 4-7)., Disp: 20 tablet, Rfl: 0 .  Prenatal Vit-Fe Fumarate-FA (PRENATAL VITAMIN PO), Take 1 tablet by mouth in the morning., Disp: , Rfl:  .  senna-docusate (SENOKOT-S) 8.6-50 MG tablet, Take 2 tablets by mouth daily., Disp: 30 tablet, Rfl: 0   No Active Allergies    The patient states she uses {contraceptive methods:5051} for birth control. Last LMP was No LMP recorded.. {Dysmenorrhea-menorrhagia:21918}. Negative for: breast discharge, breast lump(s), breast pain and breast self exam. Associated symptoms include abnormal vaginal bleeding. Pertinent  negatives include abnormal bleeding (hematology), anxiety, decreased libido, depression, difficulty falling sleep, dyspareunia, history of infertility, nocturia, sexual dysfunction, sleep disturbances, urinary incontinence, urinary urgency, vaginal discharge and vaginal itching. Diet regular.The patient states her exercise level is    . The patient's tobacco use is:  Social History   Tobacco Use  Smoking Status Never  Smokeless Tobacco Never  . She has been exposed to passive smoke. The patient's alcohol use is:  Social History   Substance and Sexual Activity  Alcohol Use Not Currently  . Additional information: Last pap ***, next one scheduled for ***.    Review of Systems   There were no vitals filed for this visit. There is no height or weight on file to calculate BMI.   Objective:  Physical Exam      Assessment And Plan:     1. Annual physical exam     Patient was given opportunity to ask questions. Patient verbalized understanding of the plan and was able to repeat key elements of the plan. All questions were answered to their satisfaction.   Nancy Page, Nancy Page   I, Nancy Page, Nancy Page, have reviewed all documentation for this visit. The documentation on 04/13/23 for the exam, diagnosis, procedures, and orders are all accurate and complete.   THE PATIENT IS ENCOURAGED TO PRACTICE SOCIAL DISTANCING DUE TO THE COVID-19  PANDEMIC.

## 2023-04-14 ENCOUNTER — Encounter: Payer: BC Managed Care – PPO | Admitting: Nurse Practitioner

## 2023-04-14 DIAGNOSIS — Z0000 Encounter for general adult medical examination without abnormal findings: Secondary | ICD-10-CM

## 2023-06-23 ENCOUNTER — Encounter: Payer: BC Managed Care – PPO | Admitting: Family Medicine

## 2023-08-27 ENCOUNTER — Encounter: Payer: BC Managed Care – PPO | Admitting: Family Medicine

## 2023-09-27 ENCOUNTER — Ambulatory Visit: Payer: BC Managed Care – PPO | Admitting: Nurse Practitioner

## 2024-01-12 DIAGNOSIS — R52 Pain, unspecified: Secondary | ICD-10-CM | POA: Diagnosis not present

## 2024-01-12 DIAGNOSIS — N760 Acute vaginitis: Secondary | ICD-10-CM | POA: Diagnosis not present

## 2024-02-01 ENCOUNTER — Ambulatory Visit: Payer: BC Managed Care – PPO | Admitting: Nurse Practitioner

## 2024-02-01 NOTE — Progress Notes (Deleted)
 Madelaine Bhat, CMA,acting as a Neurosurgeon for Arnette Felts, FNP.,have documented all relevant documentation on the behalf of Arnette Felts, FNP,as directed by  Arnette Felts, FNP while in the presence of Arnette Felts, FNP.  Subjective:  Patient ID: Nancy Page , female    DOB: 01/12/2001 , 23 y.o.   MRN: [id number]  No chief complaint on file.   HPI  HPI   Past Medical History:  Diagnosis Date   ANA positive    SVT (supraventricular tachycardia)    Vitamin D deficiency      Family History  Problem Relation Age of Onset   Hypertension Mother    Lupus Father    Heart disease Brother    Heart murmur Brother    Diabetes Maternal Aunt    Diabetes Maternal Grandmother    Stroke Maternal Grandfather    Cancer Paternal Grandmother    Asthma Neg Hx    Birth defects Neg Hx      Current Outpatient Medications:    ferrous sulfate 325 (65 FE) MG tablet, Take 1 tablet (325 mg total) by mouth 2 (two) times daily with a meal., Disp: 60 tablet, Rfl: 3   ibuprofen (ADVIL) 600 MG tablet, Take 1 tablet (600 mg total) by mouth every 6 (six) hours., Disp: 30 tablet, Rfl: 0   oxyCODONE (OXY IR/ROXICODONE) 5 MG immediate release tablet, Take 1 tablet (5 mg total) by mouth every 4 (four) hours as needed (pain scale 4-7)., Disp: 20 tablet, Rfl: 0   Prenatal Vit-Fe Fumarate-FA (PRENATAL VITAMIN PO), Take 1 tablet by mouth in the morning., Disp: , Rfl:    senna-docusate (SENOKOT-S) 8.6-50 MG tablet, Take 2 tablets by mouth daily., Disp: 30 tablet, Rfl: 0   No Active Allergies   Review of Systems   There were no vitals filed for this visit. There is no height or weight on file to calculate BMI.  Wt Readings from Last 3 Encounters:  03/23/23 126 lb (57.2 kg)  12/14/22 126 lb (57.2 kg)  11/01/22 147 lb 6.4 oz (66.9 kg)    The ASCVD Risk score (Arnett DK, et al., 2019) failed to calculate for the following reasons:   The 2019 ASCVD risk score is only valid for ages 29 to 98  Objective:   Physical Exam      Assessment And Plan:  There are no diagnoses linked to this encounter.  No follow-ups on file.  Patient was given opportunity to ask questions. Patient verbalized understanding of the plan and was able to repeat key elements of the plan. All questions were answered to their satisfaction.    Jeanell Sparrow, FNP, have reviewed all documentation for this visit. The documentation on 02/01/24 for the exam, diagnosis, procedures, and orders are all accurate and complete.   IF YOU HAVE BEEN REFERRED TO A SPECIALIST, IT MAY TAKE 1-2 WEEKS TO SCHEDULE/PROCESS THE REFERRAL. IF YOU HAVE NOT HEARD FROM US/SPECIALIST IN TWO WEEKS, PLEASE GIVE [place] A CALL AT [phone].

## 2024-02-03 DIAGNOSIS — X58XXXA Exposure to other specified factors, initial encounter: Secondary | ICD-10-CM | POA: Diagnosis not present

## 2024-02-03 DIAGNOSIS — R202 Paresthesia of skin: Secondary | ICD-10-CM | POA: Diagnosis not present

## 2024-02-03 DIAGNOSIS — S46811A Strain of other muscles, fascia and tendons at shoulder and upper arm level, right arm, initial encounter: Secondary | ICD-10-CM | POA: Diagnosis not present

## 2024-02-04 NOTE — Progress Notes (Deleted)
 Office Visit Note  Patient: Nancy Page             Date of Birth: [birth year]           MRN: [id number]             PCP: Arnette Felts, FNP Referring: Arnette Felts, FNP Visit Date: 02/15/2024 Occupation: @GUAROCC @  Subjective:    History of Present Illness: Nancy Page is a 23 y.o. female with history of positive ANA and myalgia.       Activities of Daily Living:  Patient reports morning stiffness for *** {minute/hour:19697}.   Patient {ACTIONS;DENIES/REPORTS:21021675::"Denies"} nocturnal pain.  Difficulty dressing/grooming: {ACTIONS;DENIES/REPORTS:21021675::"Denies"} Difficulty climbing stairs: {ACTIONS;DENIES/REPORTS:21021675::"Denies"} Difficulty getting out of chair: {ACTIONS;DENIES/REPORTS:21021675::"Denies"} Difficulty using hands for taps, buttons, cutlery, and/or writing: {ACTIONS;DENIES/REPORTS:21021675::"Denies"}  No Rheumatology ROS completed.   PMFS History:  Patient Active Problem List   Diagnosis Date Noted  . SVD (spontaneous vaginal delivery) 11/03/2022  . Normal postpartum course 11/03/2022  . Third degree perineal laceration 11/03/2022  . Acute blood loss anemia 11/03/2022  . Normal labor 11/01/2022  . Preterm contractions 10/03/2022    Past Medical History:  Diagnosis Date  . ANA positive   . SVT (supraventricular tachycardia)   . Vitamin D deficiency     Family History  Problem Relation Age of Onset  . Hypertension Mother   . Lupus Father   . Heart disease Brother   . Heart murmur Brother   . Diabetes Maternal Aunt   . Diabetes Maternal Grandmother   . Stroke Maternal Grandfather   . Cancer Paternal Grandmother   . Asthma Neg Hx   . Birth defects Neg Hx    Past Surgical History:  Procedure Laterality Date  . NO PAST SURGERIES     Social History   Social History Narrative  . Not on file   Immunization History  Administered Date(s) Administered  . Pfizer(Comirnaty)Fall Seasonal Vaccine 12 years and older 03/23/2023      Objective: Vital Signs: There were no vitals taken for this visit.   Physical Exam Vitals and nursing note reviewed.  Constitutional:      Appearance: She is well-developed.  HENT:     Head: Normocephalic and atraumatic.  Eyes:     Conjunctiva/sclera: Conjunctivae normal.  Cardiovascular:     Rate and Rhythm: Normal rate and regular rhythm.     Heart sounds: Normal heart sounds.  Pulmonary:     Effort: Pulmonary effort is normal.     Breath sounds: Normal breath sounds.  Abdominal:     General: Bowel sounds are normal.     Palpations: Abdomen is soft.  Musculoskeletal:     Cervical back: Normal range of motion.  Lymphadenopathy:     Cervical: No cervical adenopathy.  Skin:    General: Skin is warm and dry.     Capillary Refill: Capillary refill takes less than 2 seconds.  Neurological:     Mental Status: She is alert and oriented to person, place, and time.  Psychiatric:        Behavior: Behavior normal.     Musculoskeletal Exam: ***  CDAI Exam: CDAI Score: -- Patient Global: --; Provider Global: -- Swollen: --; Tender: -- Joint Exam 02/15/2024   No joint exam has been documented for this visit   There is currently no information documented on the homunculus. Go to the Rheumatology activity and complete the homunculus joint exam.  Investigation: No additional findings.  Imaging: No results found.  Recent Labs: Lab  Results  Component Value Date   WBC 6.4 12/14/2022   HGB 13.1 12/14/2022   PLT 395 12/14/2022   NA 138 12/14/2022   K 4.8 12/14/2022   CL 100 12/14/2022   CO2 23 12/14/2022   GLUCOSE 78 12/14/2022   BUN 11 12/14/2022   CREATININE 0.83 12/14/2022   BILITOT 0.2 04/29/2022   AST 14 04/29/2022   ALT 13 04/29/2022   PROT 6.9 04/29/2022   CALCIUM 10.1 12/14/2022   QFTBGOLDPLUS Negative 03/23/2023    Speciality Comments: No specialty comments available.  Procedures:  No procedures performed Allergies: Patient has no active allergies.    Assessment / Plan:     Visit Diagnoses: Positive ANA (antinuclear antibody)  Myalgia  Other fatigue  Chronic midline low back pain without sciatica  Vitamin D deficiency  Seasonal allergies  Orders: No orders of the defined types were placed in this encounter.  No orders of the defined types were placed in this encounter.   Face-to-face time spent with patient was *** minutes. Greater than 50% of time was spent in counseling and coordination of care.  Follow-Up Instructions: No follow-ups on file.   Gearldine Bienenstock, PA-C  Note - This record has been created using Dragon software.  Chart creation errors have been sought, but may not always  have been located. Such creation errors do not reflect on  the standard of medical care.

## 2024-02-10 DIAGNOSIS — R197 Diarrhea, unspecified: Secondary | ICD-10-CM | POA: Diagnosis not present

## 2024-02-10 DIAGNOSIS — R112 Nausea with vomiting, unspecified: Secondary | ICD-10-CM | POA: Diagnosis not present

## 2024-02-15 ENCOUNTER — Ambulatory Visit: Payer: BC Managed Care – PPO | Admitting: Physician Assistant

## 2024-02-15 DIAGNOSIS — E559 Vitamin D deficiency, unspecified: Secondary | ICD-10-CM

## 2024-02-15 DIAGNOSIS — G8929 Other chronic pain: Secondary | ICD-10-CM

## 2024-02-15 DIAGNOSIS — R768 Other specified abnormal immunological findings in serum: Secondary | ICD-10-CM

## 2024-02-15 DIAGNOSIS — J302 Other seasonal allergic rhinitis: Secondary | ICD-10-CM

## 2024-02-15 DIAGNOSIS — R5383 Other fatigue: Secondary | ICD-10-CM

## 2024-02-15 DIAGNOSIS — M791 Myalgia, unspecified site: Secondary | ICD-10-CM

## 2024-04-01 DIAGNOSIS — N3000 Acute cystitis without hematuria: Secondary | ICD-10-CM | POA: Diagnosis not present

## 2024-04-01 DIAGNOSIS — Z113 Encounter for screening for infections with a predominantly sexual mode of transmission: Secondary | ICD-10-CM | POA: Diagnosis not present

## 2024-04-26 NOTE — Progress Notes (Deleted)
 [place], CMA,acting as a Neurosurgeon for Susanna Epley, FNP.,have documented all relevant documentation on the behalf of Susanna Epley, FNP,as directed by  Susanna Epley, FNP while in the presence of Susanna Epley, FNP.  Subjective:  Patient ID: Nancy Page , female    DOB: 2001-07-19 , 23 y.o.   MRN: [id number]  No chief complaint on file.   HPI  HPI   Past Medical History:  Diagnosis Date   ANA positive    SVT (supraventricular tachycardia)    Vitamin D deficiency      Family History  Problem Relation Age of Onset   Hypertension Mother    Lupus Father    Heart disease Brother    Heart murmur Brother    Diabetes Maternal Aunt    Diabetes Maternal Grandmother    Stroke Maternal Grandfather    Cancer Paternal Grandmother    Asthma Neg Hx    Birth defects Neg Hx      Current Outpatient Medications:    ferrous sulfate  325 (65 FE) MG tablet, Take 1 tablet (325 mg total) by mouth 2 (two) times daily with a meal., Disp: 60 tablet, Rfl: 3   ibuprofen  (ADVIL ) 600 MG tablet, Take 1 tablet (600 mg total) by mouth every 6 (six) hours., Disp: 30 tablet, Rfl: 0   oxyCODONE  (OXY IR/ROXICODONE ) 5 MG immediate release tablet, Take 1 tablet (5 mg total) by mouth every 4 (four) hours as needed (pain scale 4-7)., Disp: 20 tablet, Rfl: 0   Prenatal Vit-Fe Fumarate-FA (PRENATAL VITAMIN PO), Take 1 tablet by mouth in the morning., Disp: , Rfl:    senna-docusate (SENOKOT-S) 8.6-50 MG tablet, Take 2 tablets by mouth daily., Disp: 30 tablet, Rfl: 0   No Active Allergies   Review of Systems   There were no vitals filed for this visit. There is no height or weight on file to calculate BMI.  Wt Readings from Last 3 Encounters:  03/23/23 126 lb (57.2 kg)  12/14/22 126 lb (57.2 kg)  11/01/22 147 lb 6.4 oz (66.9 kg)    The ASCVD Risk score (Arnett DK, et al., 2019) failed to calculate for the following reasons:   The 2019 ASCVD risk score is only valid for ages 61 to 57  Objective:   Physical Exam      Assessment And Plan:  There are no diagnoses linked to this encounter.  No follow-ups on file.  Patient was given opportunity to ask questions. Patient verbalized understanding of the plan and was able to repeat key elements of the plan. All questions were answered to their satisfaction.    Inge Mangle, FNP, have reviewed all documentation for this visit. The documentation on 04/26/24 for the exam, diagnosis, procedures, and orders are all accurate and complete.   IF YOU HAVE BEEN REFERRED TO A SPECIALIST, IT MAY TAKE 1-2 WEEKS TO SCHEDULE/PROCESS THE REFERRAL. IF YOU HAVE NOT HEARD FROM US /SPECIALIST IN TWO WEEKS, PLEASE GIVE US  A CALL AT [phone] X [phone].

## 2024-04-27 ENCOUNTER — Ambulatory Visit: Admitting: Nurse Practitioner

## 2024-07-04 DIAGNOSIS — N898 Other specified noninflammatory disorders of vagina: Secondary | ICD-10-CM | POA: Diagnosis not present

## 2024-08-21 ENCOUNTER — Encounter (HOSPITAL_COMMUNITY): Payer: Self-pay | Admitting: Emergency Medicine

## 2024-08-21 ENCOUNTER — Other Ambulatory Visit: Payer: Self-pay

## 2024-08-21 ENCOUNTER — Ambulatory Visit (HOSPITAL_COMMUNITY)
Admission: EM | Admit: 2024-08-21 | Discharge: 2024-08-21 | Disposition: A | Discharge status: Home / Self Care | Attending: Family Medicine | Admitting: Family Medicine

## 2024-08-21 DIAGNOSIS — L239 Allergic contact dermatitis, unspecified cause: Secondary | ICD-10-CM

## 2024-08-21 MED ORDER — CETIRIZINE HCL 10 MG PO TABS: 10.0000 mg | ORAL_TABLET | Freq: Every day | ORAL | 0 refills | Status: AC | PRN

## 2024-08-21 MED ORDER — DEXAMETHASONE SODIUM PHOSPHATE 10 MG/ML IJ SOLN
INTRAMUSCULAR | Status: AC
  Filled 2024-08-21: qty 1

## 2024-08-21 MED ORDER — DEXAMETHASONE SODIUM PHOSPHATE 10 MG/ML IJ SOLN: 10.0000 mg | Freq: Once | INTRAMUSCULAR | Status: DC

## 2024-08-21 MED ORDER — PREDNISONE 20 MG PO TABS: 40.0000 mg | ORAL_TABLET | Freq: Every day | ORAL | 0 refills | Status: AC

## 2024-08-21 NOTE — ED Provider Notes (Addendum)
 [facility]    CSN: [id number] Arrival date & time: 08/21/24  1635      History   Chief Complaint No chief complaint on file.   HPI Nancy Page is a 23 y.o. female.   HPI Here for itching and red rash on her face.  It began about noon today.  Earlier this morning she had put a new lotion on her face.  No trouble with tongue or throat swelling and no trouble breathing today.  She is not allergic to any medications  She is no longer breast-feeding Past Medical History:  Diagnosis Date   ANA positive    SVT (supraventricular tachycardia) (HCC)    Vitamin D deficiency     Patient Active Problem List   Diagnosis Date Noted   SVD (spontaneous vaginal delivery) 11/03/2022   Normal postpartum course 11/03/2022   Third degree perineal laceration 11/03/2022   Acute blood loss anemia 11/03/2022   Normal labor 11/01/2022   Preterm contractions 10/03/2022    Past Surgical History:  Procedure Laterality Date   NO PAST SURGERIES      OB History     Gravida  1   Para  1   Term  1   Preterm      AB      Living  1      SAB      IAB      Ectopic      Multiple  0   Live Births  1            Home Medications    Prior to Admission medications   Medication Sig Start Date End Date Taking? Authorizing Provider  cetirizine  (ZYRTEC  ALLERGY) 10 MG tablet Take 1 tablet (10 mg total) by mouth daily as needed for allergies (or itching). 08/21/24  Yes Vonna Sharlet POUR, MD  predniSONE  (DELTASONE ) 20 MG tablet Take 2 tablets (40 mg total) by mouth daily with breakfast for 5 days. 08/21/24 08/26/24 Yes Ramsay Bognar, Sharlet POUR, MD  senna-docusate (SENOKOT-S) 8.6-50 MG tablet Take 2 tablets by mouth daily. 11/05/22   [place] , Jade, FNP  fluticasone  (FLONASE ) 50 MCG/ACT nasal spray Place 2 sprays into both nostrils daily. 10/03/17 03/18/21  Babara Greig GAILS, PA-C  levonorgestrel-ethinyl estradiol (SEASONALE,INTROVALE,JOLESSA) 0.15-0.03 MG tablet Take 1 tablet by  mouth daily.  03/18/21  [provider]    Family History Family History  Problem Relation Age of Onset   Hypertension Mother    Lupus Father    Heart disease Brother    Heart murmur Brother    Diabetes Maternal Aunt    Diabetes Maternal Grandmother    Stroke Maternal Grandfather    Cancer Paternal Grandmother    Asthma Neg Hx    Birth defects Neg Hx     Social History Social History   Tobacco Use   Smoking status: Never   Smokeless tobacco: Never  Vaping Use   Vaping status: Never Used  Substance Use Topics   Alcohol use: Yes   Drug use: Never     Allergies   Patient has no known allergies.   Review of Systems Review of Systems   Physical Exam Triage Vital Signs ED Triage Vitals  Encounter Vitals Group     BP 08/21/24 1650 123/81     Girls Systolic BP Percentile --      Girls Diastolic BP Percentile --      Boys Systolic BP Percentile --      Boys Diastolic  BP Percentile --      Pulse Rate 08/21/24 1650 69     Resp 08/21/24 1650 17     Temp 08/21/24 1650 98.8 F (37.1 C)     Temp Source 08/21/24 1650 Oral     SpO2 08/21/24 1650 100 %     Weight --      Height --      Head Circumference --      Peak Flow --      Pain Score 08/21/24 1646 0     Pain Loc --      Pain Education --      Exclude from Growth Chart --    No data found.  Updated Vital Signs BP 123/81 (BP Location: Right Arm)   Pulse 69   Temp 98.8 F (37.1 C) (Oral)   Resp 17   LMP 08/08/2024   SpO2 100%   Visual Acuity Right Eye Distance:   Left Eye Distance:   Bilateral Distance:    Right Eye Near:   Left Eye Near:    Bilateral Near:     Physical Exam Vitals reviewed.  Constitutional:      General: She is not in acute distress.    Appearance: She is not ill-appearing, toxic-appearing or diaphoretic.  HENT:     Mouth/Throat:     Mouth: Mucous membranes are moist.     Comments: Oropharynx benign.  Lips and tongue are not swollen Eyes:     Extraocular  Movements: Extraocular movements intact.     Conjunctiva/sclera: Conjunctivae normal.     Pupils: Pupils are equal, round, and reactive to light.  Cardiovascular:     Rate and Rhythm: Normal rate and regular rhythm.     Heart sounds: No murmur heard. Pulmonary:     Effort: Pulmonary effort is normal.     Breath sounds: Normal breath sounds.  Musculoskeletal:     Cervical back: Neck supple.  Lymphadenopathy:     Cervical: No cervical adenopathy.  Skin:    Coloration: Skin is not pale.     Comments: There is no erythematous rash on her cheeks and forehead and chin.    Neurological:     General: No focal deficit present.     Mental Status: She is alert and oriented to person, place, and time.  Psychiatric:        Behavior: Behavior normal.      UC Treatments / Results  Labs (all labs ordered are listed, but only abnormal results are displayed) Labs Reviewed - No data to display  EKG   Radiology No results found.  Procedures Procedures (including critical care time)  Medications Ordered in UC Medications  dexamethasone  (DECADRON ) injection 10 mg (has no administration in time range)  dexamethasone  (DECADRON ) injection 10 mg (has no administration in time range)    Initial Impression / Assessment and Plan / UC Course  I have reviewed the triage vital signs and the nursing notes.  Pertinent labs & imaging results that were available during my care of the patient were reviewed by me and considered in my medical decision making (see chart for details).     Decadron  injections given here and 5-day burst of prednisone  is sent in to the pharmacy along with Zyrtec  Final Clinical Impressions(s) / UC Diagnoses   Final diagnoses:  Allergic dermatitis     Discharge Instructions      You have been given a shot of dexamethasone  10 mg, steroid.  Take prednisone  20 mg--2  daily for 5 days  Zyrtec /cetirizine  10 mg tablet--take 1 daily as needed for allergy or  itching.      ED Prescriptions     Medication Sig Dispense Auth. Provider   predniSONE  (DELTASONE ) 20 MG tablet Take 2 tablets (40 mg total) by mouth daily with breakfast for 5 days. 10 tablet Vonna Sharlet POUR, MD   cetirizine  (ZYRTEC  ALLERGY) 10 MG tablet Take 1 tablet (10 mg total) by mouth daily as needed for allergies (or itching). 30 tablet Gildo Crisco K, MD      PDMP not reviewed this encounter.   Vonna Sharlet POUR, MD 08/21/24 1854    Vonna Sharlet POUR, MD 08/21/24 [phone]

## 2024-08-21 NOTE — ED Triage Notes (Addendum)
 Went to work around 10 am.  Face started itching around 12.  1:00 took allegra.  Patient took a nap .  Face is red, itching.  Denies any make up products, new or otherwise.  Denis use of sunscreen products.  Eos lotion used this morning.  -patient says she has not used this before.  This was put on face around 8 am today.  Patient has washed face after onset of symptoms  Patient denies throat swelling, speaking in complete sentences.  No sob

## 2024-08-21 NOTE — ED Notes (Signed)
 Patient has been instructed to notify staff if any changes

## 2024-08-21 NOTE — Discharge Instructions (Addendum)
 You have been given a shot of dexamethasone  10 mg, steroid.  Take prednisone  20 mg--2 daily for 5 days  Zyrtec /cetirizine  10 mg tablet--take 1 daily as needed for allergy or itching.

## 2024-09-04 ENCOUNTER — Emergency Department (HOSPITAL_BASED_OUTPATIENT_CLINIC_OR_DEPARTMENT_OTHER): Admission: EM | Admit: 2024-09-04 | Discharge: 2024-09-04 | Disposition: A | Discharge status: Home / Self Care

## 2024-09-04 ENCOUNTER — Emergency Department (HOSPITAL_BASED_OUTPATIENT_CLINIC_OR_DEPARTMENT_OTHER): Admitting: Radiology

## 2024-09-04 ENCOUNTER — Encounter (HOSPITAL_BASED_OUTPATIENT_CLINIC_OR_DEPARTMENT_OTHER): Payer: Self-pay | Admitting: Emergency Medicine

## 2024-09-04 DIAGNOSIS — R82998 Other abnormal findings in urine: Secondary | ICD-10-CM | POA: Insufficient documentation

## 2024-09-04 DIAGNOSIS — R5383 Other fatigue: Secondary | ICD-10-CM | POA: Insufficient documentation

## 2024-09-04 DIAGNOSIS — R631 Polydipsia: Secondary | ICD-10-CM | POA: Insufficient documentation

## 2024-09-04 DIAGNOSIS — R002 Palpitations: Secondary | ICD-10-CM | POA: Diagnosis not present

## 2024-09-04 LAB — COMPREHENSIVE METABOLIC PANEL WITH GFR
ALT: 24 U/L (ref 0–44)
AST: 40 U/L (ref 15–41)
Albumin: 4.9 g/dL (ref 3.5–5.0)
Alkaline Phosphatase: 115 U/L (ref 38–126)
Anion gap: 16 — ABNORMAL HIGH (ref 5–15)
BUN: 10 mg/dL (ref 6–20)
CO2: 22 mmol/L (ref 22–32)
Calcium: 9.8 mg/dL (ref 8.9–10.3)
Chloride: 102 mmol/L (ref 98–111)
Creatinine, Ser: 0.85 mg/dL (ref 0.44–1.00)
GFR, Estimated: >60 mL/min (ref >60)
Glucose, Bld: 87 mg/dL (ref 70–99)
Potassium: 3.9 mmol/L (ref 3.5–5.1)
Sodium: 140 mmol/L (ref 135–145)
Total Bilirubin: 0.5 mg/dL (ref 0.0–1.2)
Total Protein: 8.9 g/dL — ABNORMAL HIGH (ref 6.5–8.1)

## 2024-09-04 LAB — TROPONIN T, HIGH SENSITIVITY
Troponin T High Sensitivity: <15 ng/L (ref 0–19)
Troponin T High Sensitivity: <15 ng/L (ref 0–19)

## 2024-09-04 LAB — CBC WITH DIFFERENTIAL/PLATELET
Abs Immature Granulocytes: 0.03 K/uL (ref 0.00–0.07)
Basophils Absolute: 0 K/uL (ref 0.0–0.1)
Basophils Relative: 0 %
Eosinophils Absolute: 0 K/uL (ref 0.0–0.5)
Eosinophils Relative: 0 %
HCT: 40.8 % (ref 36.0–46.0)
Hemoglobin: 14 g/dL (ref 12.0–15.0)
Immature Granulocytes: 0 %
Lymphocytes Relative: 23 %
Lymphs Abs: 2.3 K/uL (ref 0.7–4.0)
MCH: 31 pg (ref 26.0–34.0)
MCHC: 34.3 g/dL (ref 30.0–36.0)
MCV: 90.5 fL (ref 80.0–100.0)
Monocytes Absolute: 0.5 K/uL (ref 0.1–1.0)
Monocytes Relative: 5 %
Neutro Abs: 7.4 K/uL (ref 1.7–7.7)
Neutrophils Relative %: 72 %
Platelets: 373 K/uL (ref 150–400)
RBC: 4.51 MIL/uL (ref 3.87–5.11)
RDW: 13.6 % (ref 11.5–15.5)
WBC: 10.3 K/uL (ref 4.0–10.5)
nRBC: 0 % (ref 0.0–0.2)

## 2024-09-04 LAB — URINALYSIS, ROUTINE W REFLEX MICROSCOPIC
Bilirubin Urine: NEGATIVE
Glucose, UA: NEGATIVE mg/dL
Hgb urine dipstick: NEGATIVE
Nitrite: NEGATIVE
Protein, ur: 30 mg/dL — AB
Specific Gravity, Urine: 1.029 (ref 1.005–1.030)
WBC, UA: >50 WBC/hpf (ref 0–5)
pH: 6 (ref 5.0–8.0)

## 2024-09-04 LAB — WET PREP, GENITAL
Clue Cells Wet Prep HPF POC: NONE SEEN
Sperm: NONE SEEN
Trich, Wet Prep: NONE SEEN
WBC, Wet Prep HPF POC: >=10 — AB (ref <10)
Yeast Wet Prep HPF POC: NONE SEEN

## 2024-09-04 LAB — LIPASE, BLOOD: Lipase: 19 U/L (ref 11–51)

## 2024-09-04 LAB — D-DIMER, QUANTITATIVE: D-Dimer, Quant: 0.39 ug{FEU}/mL (ref 0.00–0.50)

## 2024-09-04 LAB — RESP PANEL BY RT-PCR (RSV, FLU A&B, COVID)  RVPGX2
Influenza A by PCR: NEGATIVE
Influenza B by PCR: NEGATIVE
Resp Syncytial Virus by PCR: NEGATIVE
SARS Coronavirus 2 by RT PCR: NEGATIVE

## 2024-09-04 LAB — PREGNANCY, URINE: Preg Test, Ur: NEGATIVE

## 2024-09-04 MED ORDER — SODIUM CHLORIDE 0.9 % IV BOLUS
1000.0000 mL | Freq: Once | INTRAVENOUS | Status: AC
  Administered 2024-09-04: 1000 mL via INTRAVENOUS

## 2024-09-04 MED ORDER — ONDANSETRON 4 MG PO TBDP: 8.0000 mg | ORAL_TABLET | Freq: Once | ORAL | Status: DC

## 2024-09-04 MED ORDER — ONDANSETRON HCL 4 MG/2ML IJ SOLN
4.0000 mg | Freq: Once | INTRAMUSCULAR | Status: AC
  Administered 2024-09-04: 4 mg via INTRAVENOUS
  Filled 2024-09-04: qty 2

## 2024-09-04 NOTE — ED Notes (Signed)
 Patient reports decrease in nausea request water. Water given and tolerated

## 2024-09-04 NOTE — ED Provider Notes (Signed)
 [place] EMERGENCY DEPARTMENT AT [facility] Provider Note   CSN: [id number] Arrival date & time: 09/04/24  1111     Patient presents with: Fatigue and Dehydration   Nancy Page is a 23 y.o. female.   23 y.o female with a PMH of Lupus presents to the ED with a chief complaint of fatigue, dehydration.  Patient reports her symptoms have been ongoing for the past 2 to 3 weeks, worsened over the past week where she feels like I am dehydrated, continued to have a lot of fatigue .  She reports she drinks approximately Stanley cup several times a day.  She reports has been up keeping with her oral intake.  She does have the feeling of having a lupus flareup, did have a rash over the last week but this has now improved.  She is currently finding a yeast infection, reports there has not been any lower abdominal pain or any vaginal discharge.  She has not endorses any vomiting, chest pain, shortness of breath.  The history is provided by the patient.       Prior to Admission medications   Medication Sig Start Date End Date Taking? Authorizing Provider  cetirizine  (ZYRTEC  ALLERGY) 10 MG tablet Take 1 tablet (10 mg total) by mouth daily as needed for allergies (or itching). 08/21/24   Vonna Sharlet POUR, MD  senna-docusate (SENOKOT-S) 8.6-50 MG tablet Take 2 tablets by mouth daily. 11/05/22   [place] , Jade, FNP  fluticasone  (FLONASE ) 50 MCG/ACT nasal spray Place 2 sprays into both nostrils daily. 10/03/17 03/18/21  Babara Greig GAILS, PA-C  levonorgestrel-ethinyl estradiol (SEASONALE,INTROVALE,JOLESSA) 0.15-0.03 MG tablet Take 1 tablet by mouth daily.  03/18/21  [provider]    Allergies: Patient has no known allergies.    Review of Systems  Constitutional:  Positive for fatigue. Negative for chills and fever.  Respiratory:  Negative for shortness of breath.   Cardiovascular:  Negative for chest pain.  Gastrointestinal:  Negative for abdominal pain, nausea and vomiting.   Musculoskeletal:  Negative for back pain.  All other systems reviewed and are negative.   Updated Vital Signs BP 115/72   Pulse (!) 102   Temp 99.1 F (37.3 C) (Oral)   Resp 18   LMP 08/08/2024   SpO2 100%   Physical Exam Vitals and nursing note reviewed.  Constitutional:      General: She is not in acute distress.    Appearance: She is well-developed.  HENT:     Head: Normocephalic and atraumatic.     Mouth/Throat:     Pharynx: No oropharyngeal exudate.  Eyes:     Pupils: Pupils are equal, round, and reactive to light.  Cardiovascular:     Rate and Rhythm: Regular rhythm.     Heart sounds: Normal heart sounds.  Pulmonary:     Effort: Pulmonary effort is normal. No respiratory distress.     Breath sounds: Normal breath sounds.  Abdominal:     General: Bowel sounds are normal. There is no distension.     Palpations: Abdomen is soft.     Tenderness: There is no abdominal tenderness.  Musculoskeletal:        General: No tenderness or deformity.     Cervical back: Normal range of motion.     Right lower leg: No edema.     Left lower leg: No edema.  Skin:    General: Skin is warm and dry.  Neurological:     Mental Status: She is alert  and oriented to person, place, and time.     (all labs ordered are listed, but only abnormal results are displayed) Labs Reviewed  WET PREP, GENITAL - Abnormal; Notable for the following components:      Result Value   WBC, Wet Prep HPF POC >=10 (*)    All other components within normal limits  COMPREHENSIVE METABOLIC PANEL WITH GFR - Abnormal; Notable for the following components:   Total Protein 8.9 (*)    Anion gap 16 (*)    All other components within normal limits  URINALYSIS, ROUTINE W REFLEX MICROSCOPIC - Abnormal; Notable for the following components:   APPearance HAZY (*)    Ketones, ur TRACE (*)    Protein, ur 30 (*)    Leukocytes,Ua LARGE (*)    Bacteria, UA RARE (*)    All other components within normal limits   RESP PANEL BY RT-PCR (RSV, FLU A&B, COVID)  RVPGX2  WET PREP, GENITAL  CBC WITH DIFFERENTIAL/PLATELET  LIPASE, BLOOD  PREGNANCY, URINE  D-DIMER, QUANTITATIVE  GC/CHLAMYDIA PROBE AMP ([place]) NOT AT [facility]  TROPONIN T, HIGH SENSITIVITY  TROPONIN T, HIGH SENSITIVITY    EKG: EKG Interpretation Date/Time:  Monday September 04 2024 12:31:31 EDT Ventricular Rate:  106 PR Interval:  134 QRS Duration:  82 QT Interval:  324 QTC Calculation: 430 R Axis:   -50  Text Interpretation: Sinus tachycardia Right atrial enlargement Left anterior fascicular block Confirmed by Neysa Clap [phone]) on 09/04/2024 3:17:01 PM  Radiology: [facility] Chest 1 View Result Date: 09/04/2024 CLINICAL DATA:  palpitations EXAM: CHEST  1 VIEW COMPARISON:  07/21/2021 FINDINGS: No focal airspace consolidation, pleural effusion, or pneumothorax. No cardiomegaly.No acute fracture or destructive lesion. IMPRESSION: No acute cardiopulmonary abnormality. Electronically Signed   By: Rogelia Myers M.D.   On: 09/04/2024 13:35     Procedures   Medications Ordered in the ED  ondansetron  (ZOFRAN ) injection 4 mg (4 mg Intravenous Given 09/04/24 1612)  sodium chloride  0.9 % bolus 1,000 mL (0 mLs Intravenous Stopped 09/04/24 1728)    Clinical Course as of 09/04/24 1834  Mon Sep 04, 2024  1533 Leukocytes,Ua(!): LARGE [JS]  1533 WBC, UA: >50 [JS]  1533 Bacteria, UA(!): RARE [JS]  1555 D-Dimer, Quant: 0.39 [JS]  1828 WBC, Wet Prep HPF POC(!): >=10 [JS]    Clinical Course User Index [JS] Onesha Krebbs, PA-C                                 Medical Decision Making Amount and/or Complexity of Data Reviewed Labs: ordered. Decision-making details documented in ED Course.  Risk Prescription drug management.   This patient presents to the ED for concern of fatigue, this involves a number of treatment options, and is a complaint that carries with it a high risk of complications and morbidity.  The differential diagnosis  includes lupus flareup, infection, electrolyte derangement.    Co morbidities: Discussed in HPI   Brief History:  See HPI.   EMR reviewed including pt PMHx, past surgical history and past visits to ER.   See HPI for more details   Lab Tests:  I ordered and independently interpreted labs.  The pertinent results include:    I personally reviewed all laboratory work and imaging. Metabolic panel without any acute abnormality specifically kidney function within normal limits and no significant electrolyte abnormalities. CBC without leukocytosis or significant anemia.  Urinalysis with large leukocytes, rare bacteria,  greater than 50 white blood cell count, currently finding a yeast infection.  Pregnancy test is negative.   Imaging Studies:  NAD. I personally reviewed all imaging studies and no acute abnormality found. I agree with radiology interpretation.  Medicines ordered:  I ordered medication including Zofran , bolus for symptomatic treatment Reevaluation of the patient after these medicines showed that the patient improved I have reviewed the patients home medicines and have made adjustments as needed  Reevaluation:  After the interventions noted above I re-evaluated patient and found that they have :improved  Social Determinants of Health:  The patient's social determinants of health were a factor in the care of this patient  Problem List / ED Course:  Patient presented to the ED with a complaint of fatigue that has been ongoing for the past 2 to 3 weeks, worsening over the past week.  She reports she feels very thirsty, feels her she has not had any oral intake.  However, she has been drinking her Stanley pump daily.  She tells me that she had a rash for her lupus a couple of days ago and this has now resolved.  She is currently fighting an active infection such as a yeast infection with medicine.  Has not had any abdominal pain, chest pain, shortness of breath. Blood  work is reviewed and interpreted by me, CBC with no leukopenia to suggest active disease.  Did not get an ESR or CRP level.  CMP without any electrolyte derangement, creatinine level is normal.  LFTs are within normal limits.  UA does have.  No 50 white blood cell count, large leukocytes, rare bacteria, he tells me she does not have any vaginal discharge but will obtain wet prep along with GC. Patient was screened in triage, where D-dimer was ordered, she tells me that she does not have any chest pain, no shortness of breath and with reassuring negative D-dimer I do not have suspicion for pulmonary embolism at this time. He was given fluids, Zofran  to help with symptomatic treatment. Does report improvement in symptoms.  GC and wet prep were obtained via self swab. Wet prep is positive for white blood cell count, suspect likely due to active candidiasis.  She is hemodynamically stable for discharge. We did discuss follow-up for her gonorrhea and chlamydia online via MyChart which she will do.   Dispostion:  After consideration of the diagnostic results and the patients response to treatment, I feel that the patent would benefit from close follow-up with PCP, check her MyChart.  Return for worsening symptoms.   Portions of this note were generated with Scientist, clinical (histocompatibility and immunogenetics). Dictation errors may occur despite best attempts at proofreading.   Final diagnoses:  Other fatigue    ED Discharge Orders     None          Maureen Broad, PA-C 09/04/24 1834    Neysa Caron PARAS, DO 09/04/24 2357

## 2024-09-04 NOTE — ED Provider Triage Note (Addendum)
 Emergency Medicine Provider Triage Evaluation Note  Nancy Page , a 23 y.o. female  was evaluated in triage.  Pt complains of  fatigue.  Reports a 1 week of nausea and vomiting. Denies abdominal pain. Also reporting intermittent palpitations but no chest pain. H/o lupus. Also concerned she may have a yeast infection. Review of Systems  Positive: See above Negative: See above  Physical Exam  LMP 08/08/2024  Gen:   Awake, no distress   Resp:  Normal effort  MSK:   Moves extremities without difficulty  Other:    Medical Decision Making  Medically screening exam initiated at 12:22 PM.  Appropriate orders placed.  AUDRENA TALAGA was informed that the remainder of the evaluation will be completed by another provider, this initial triage assessment does not replace that evaluation, and the importance of remaining in the ED until their evaluation is complete.  Work up started   Lang Norleen POUR, PA-C 09/04/24 1223    Lang Norleen POUR, PA-C 09/04/24 1227

## 2024-09-04 NOTE — Discharge Instructions (Addendum)
 Your laboratories also within normal limits today.  Follow-up with your primary care physician as needed.  If experience any worsening symptoms please return to emergency department.

## 2024-09-04 NOTE — ED Triage Notes (Signed)
 Pt arrives with c/o being extremely fatigued, weak and decreased intake for last week. N/V but denies diarrhea.  Hx Lupus

## 2024-09-05 LAB — GC/CHLAMYDIA PROBE AMP (~~LOC~~) NOT AT ARMC
Chlamydia: NEGATIVE
Comment: NEGATIVE
Comment: NORMAL
Neisseria Gonorrhea: NEGATIVE

## 2024-09-14 ENCOUNTER — Encounter: Payer: Self-pay | Admitting: Nurse Practitioner

## 2024-09-14 ENCOUNTER — Ambulatory Visit: Admitting: Nurse Practitioner

## 2024-09-14 NOTE — Progress Notes (Deleted)
 LILLETTE Kristeen JINNY Gladis, CMA,acting as a Neurosurgeon for Gaines Ada, FNP.,have documented all relevant documentation on the behalf of Gaines Ada, FNP,as directed by  Gaines Ada, FNP while in the presence of Gaines Ada, FNP.  Subjective:  Patient ID: Nancy Page , female    DOB: 05-19-2001 , 23 y.o.   MRN: [id number]  No chief complaint on file.   HPI  HPI   Past Medical History:  Diagnosis Date   ANA positive    SVT (supraventricular tachycardia)    Vitamin D deficiency      Family History  Problem Relation Age of Onset   Hypertension Mother    Lupus Father    Heart disease Brother    Heart murmur Brother    Diabetes Maternal Aunt    Diabetes Maternal Grandmother    Stroke Maternal Grandfather    Cancer Paternal Grandmother    Asthma Neg Hx    Birth defects Neg Hx      Current Outpatient Medications:    cetirizine  (ZYRTEC  ALLERGY) 10 MG tablet, Take 1 tablet (10 mg total) by mouth daily as needed for allergies (or itching)., Disp: 30 tablet, Rfl: 0   senna-docusate (SENOKOT-S) 8.6-50 MG tablet, Take 2 tablets by mouth daily., Disp: 30 tablet, Rfl: 0   No Known Allergies   Review of Systems   There were no vitals filed for this visit. There is no height or weight on file to calculate BMI.  Wt Readings from Last 3 Encounters:  03/23/23 126 lb (57.2 kg)  12/14/22 126 lb (57.2 kg)  11/01/22 147 lb 6.4 oz (66.9 kg)    The ASCVD Risk score (Arnett DK, et al., 2019) failed to calculate for the following reasons:   The 2019 ASCVD risk score is only valid for ages 54 to 68  Objective:  Physical Exam      Assessment And Plan:  There are no diagnoses linked to this encounter.  No follow-ups on file.  Patient was given opportunity to ask questions. Patient verbalized understanding of the plan and was able to repeat key elements of the plan. All questions were answered to their satisfaction.    LILLETTE Gaines Ada, FNP, have reviewed all documentation for this visit. The  documentation on 09/14/24 for the exam, diagnosis, procedures, and orders are all accurate and complete.   IF YOU HAVE BEEN REFERRED TO A SPECIALIST, IT MAY TAKE 1-2 WEEKS TO SCHEDULE/PROCESS THE REFERRAL. IF YOU HAVE NOT HEARD FROM US /SPECIALIST IN TWO WEEKS, PLEASE GIVE US  A CALL AT [phone] X [phone].
